# Patient Record
Sex: Female | Born: 1955
Health system: Southern US, Community
[De-identification: ages and names within clinical notes are randomized; demographics above are authoritative.]

## PROBLEM LIST (undated history)

## (undated) DIAGNOSIS — H905 Unspecified sensorineural hearing loss: Secondary | ICD-10-CM

## (undated) DIAGNOSIS — I10 Essential (primary) hypertension: Secondary | ICD-10-CM

## (undated) DIAGNOSIS — J329 Chronic sinusitis, unspecified: Secondary | ICD-10-CM

## (undated) DIAGNOSIS — G43909 Migraine, unspecified, not intractable, without status migrainosus: Secondary | ICD-10-CM

## (undated) HISTORY — DX: Essential (primary) hypertension: I10

## (undated) HISTORY — DX: Morbid (severe) obesity due to excess calories: E66.01

## (undated) HISTORY — DX: Chronic sinusitis, unspecified: J32.9

## (undated) HISTORY — DX: Unspecified sensorineural hearing loss: H90.5

## (undated) HISTORY — DX: Migraine, unspecified, not intractable, without status migrainosus: G43.909

## (undated) HISTORY — PX: BACK SURGERY: SHX140

---
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
## 1983-07-27 HISTORY — PX: BACK SURGERY: SHX140

## 1997-11-08 ENCOUNTER — Other Ambulatory Visit: Admission: RE | Admit: 1997-11-08 | Discharge: 1997-11-08 | Payer: Self-pay | Admitting: Obstetrics and Gynecology

## 2005-01-14 ENCOUNTER — Ambulatory Visit: Payer: Self-pay | Admitting: Family Medicine

## 2006-07-26 HISTORY — PX: ABDOMINAL HYSTERECTOMY: SHX81

## 2006-12-21 ENCOUNTER — Ambulatory Visit: Payer: Self-pay | Admitting: Obstetrics and Gynecology

## 2006-12-28 ENCOUNTER — Ambulatory Visit: Payer: Self-pay | Admitting: Obstetrics and Gynecology

## 2007-01-06 ENCOUNTER — Inpatient Hospital Stay: Payer: Self-pay | Admitting: Obstetrics and Gynecology

## 2007-01-24 ENCOUNTER — Ambulatory Visit: Payer: Self-pay | Admitting: Family Medicine

## 2007-01-25 ENCOUNTER — Ambulatory Visit: Payer: Self-pay | Admitting: Family Medicine

## 2007-02-16 ENCOUNTER — Ambulatory Visit: Payer: Self-pay | Admitting: Family Medicine

## 2009-04-30 ENCOUNTER — Ambulatory Visit: Payer: Self-pay | Admitting: Family Medicine

## 2010-09-23 ENCOUNTER — Other Ambulatory Visit: Payer: Self-pay | Admitting: Sports Medicine

## 2010-09-23 DIAGNOSIS — M5410 Radiculopathy, site unspecified: Secondary | ICD-10-CM

## 2010-09-23 DIAGNOSIS — M545 Low back pain, unspecified: Secondary | ICD-10-CM

## 2010-09-24 ENCOUNTER — Ambulatory Visit
Admission: RE | Admit: 2010-09-24 | Discharge: 2010-09-24 | Disposition: A | Discharge status: Home / Self Care | Payer: BC Managed Care – PPO | Source: Ambulatory Visit | Attending: Sports Medicine | Admitting: Sports Medicine

## 2010-09-24 DIAGNOSIS — M545 Low back pain, unspecified: Secondary | ICD-10-CM

## 2010-09-24 DIAGNOSIS — M5410 Radiculopathy, site unspecified: Secondary | ICD-10-CM

## 2010-09-24 MED ORDER — GADOBENATE DIMEGLUMINE 529 MG/ML IV SOLN
20.0000 mL | Freq: Once | INTRAVENOUS | Status: AC | PRN
  Administered 2010-09-24: 20 mL via INTRAVENOUS

## 2011-01-19 ENCOUNTER — Encounter (HOSPITAL_COMMUNITY)
Admission: RE | Admit: 2011-01-19 | Discharge: 2011-01-19 | Disposition: A | Discharge status: Home / Self Care | Payer: BC Managed Care – PPO | Source: Ambulatory Visit | Attending: Neurosurgery | Admitting: Neurosurgery

## 2011-01-19 LAB — CBC
HCT: 42.1 % (ref 36.0–46.0)
MCV: 81.3 fL (ref 78.0–100.0)
RDW: 14.9 % (ref 11.5–15.5)
WBC: 9.5 10*3/uL (ref 4.0–10.5)

## 2011-01-19 LAB — URINALYSIS, ROUTINE W REFLEX MICROSCOPIC
Bilirubin Urine: NEGATIVE
Hgb urine dipstick: NEGATIVE
Specific Gravity, Urine: 1.009 (ref 1.005–1.030)
Urobilinogen, UA: 0.2 mg/dL (ref 0.0–1.0)
pH: 5.5 (ref 5.0–8.0)

## 2011-01-19 LAB — BASIC METABOLIC PANEL
BUN: 14 mg/dL (ref 6–23)
CO2: 25 mEq/L (ref 19–32)
Calcium: 10.6 mg/dL — ABNORMAL HIGH (ref 8.4–10.5)
Chloride: 103 mEq/L (ref 96–112)
Creatinine, Ser: 0.65 mg/dL (ref 0.50–1.10)

## 2011-01-19 LAB — PROTIME-INR: INR: 0.96 (ref 0.00–1.49)

## 2011-01-20 ENCOUNTER — Observation Stay (HOSPITAL_COMMUNITY)
Admission: RE | Admit: 2011-01-20 | Discharge: 2011-01-21 | Disposition: A | Discharge status: Home / Self Care | Payer: BC Managed Care – PPO | Source: Ambulatory Visit | Attending: Neurosurgery | Admitting: Neurosurgery

## 2011-01-20 ENCOUNTER — Observation Stay (HOSPITAL_COMMUNITY): Payer: BC Managed Care – PPO

## 2011-01-20 DIAGNOSIS — Z01810 Encounter for preprocedural cardiovascular examination: Secondary | ICD-10-CM | POA: Insufficient documentation

## 2011-01-20 DIAGNOSIS — Z01812 Encounter for preprocedural laboratory examination: Secondary | ICD-10-CM | POA: Insufficient documentation

## 2011-01-20 DIAGNOSIS — E669 Obesity, unspecified: Secondary | ICD-10-CM | POA: Insufficient documentation

## 2011-01-20 DIAGNOSIS — Z79899 Other long term (current) drug therapy: Secondary | ICD-10-CM | POA: Insufficient documentation

## 2011-01-20 DIAGNOSIS — Z01818 Encounter for other preprocedural examination: Secondary | ICD-10-CM | POA: Insufficient documentation

## 2011-01-20 DIAGNOSIS — M1990 Unspecified osteoarthritis, unspecified site: Secondary | ICD-10-CM | POA: Insufficient documentation

## 2011-01-20 DIAGNOSIS — M5126 Other intervertebral disc displacement, lumbar region: Principal | ICD-10-CM | POA: Insufficient documentation

## 2011-01-21 NOTE — Op Note (Signed)
[name], [name]              ACCOUNT NO.:  [id number]  MEDICAL RECORD NO.:  [id number]  LOCATION:  3533                         FACILITY:  [facility]  PHYSICIAN:  Clydene Fake, M.D.  DATE OF BIRTH:  08/13/1955  DATE OF PROCEDURE:  01/20/2011 DATE OF DISCHARGE:                              OPERATIVE REPORT   DIAGNOSIS:  Recurrent herniated nucleus pulposus, left L4-5.  POSTOPERATIVE DIAGNOSIS:  Recurrent herniated nucleus pulposus, left L4- 5.  PROCEDURE:  Redo left L4-5 semi-hemilaminectomy and diskectomy, microdissection with microscope.  SURGEON:  Clydene Fake, MD.  ASSISTANT:  Coletta Memos, MD.  ANESTHESIA:  General endotracheal tube anesthesia.  ESTIMATED BLOOD LOSS:  Minimal.  BLOOD GIVEN:  None.  DRAINS:  None.  COMPLICATIONS:  None.  REASON FOR PROCEDURE:  The patient is a 55 year old woman who underwent the left lam and diskectomy over 2 months ago.  Almost in 3 months, she was doing well and back to work but then had some severe back pain, left leg pain and numbness recurrent, pain radiating down towards the foot, mainly L5 distribution.  MRI was done showing large recurrent disk herniation, left side L4-5 with fragment going caudally on the nerve root.  The patient was brought back in for redo lam and diskectomy.  PROCEDURE IN DETAIL:  The patient was brought to the operating room and general anesthesia was induced.  The patient was placed in a prone position on Wilson frame with all pressure points padded.  The patient was prepped and draped in usual sterile fashion.  Site of incision was injected with 10 mL of 1% lidocaine with epinephrine.  Incision was then made in the middle and lower lumbar spine where incision was previously. Incision was taken down the fascia.  Hemostasis was obtained with Bovie cauterization.  The fascia was excised with Bovie.  We can see the track from her prior surgeries.  Subperiosteal dissection was done over  the spinous process of lamina at L4-5.  Markers were placed at the 2 interspaces.  An x-ray was obtained in the top marker which we thought was ___________ pointing at the scar and the defect, it was in fact the 4-5 level.  Self-retaining retractors were placed at the center there. Microscope was brought in for microdissection.  There was a significant amount of scar in the laminotomy defect.  We carefully dissected through that scar with curettes and nerve hooks.  We used high-speed drill to remove the bone from laminotomy and medial facetectomy, and we removed more bone caudally over the top of L5 lamina decompressing the sac and the nerve root and decompressed that more inferiorly.  We carefully worked right down towards the disk space through a previous scar.  There was a significant amount of scar.  When we get into the disk space, diskectomy was done with pituitary rongeurs and curettes and we carefully dissected towards the nerve root and the dura and then pushing disk material back down the disk space and removing it.  The L5 nerveroot was very tense and a lot of disk material was caudal to the disk space up under the nerve root and the axilla.  We carefully dissected this and  we were able to remove this disk material decompressing the nerve root.  When we were finished, we had good decompression of the central canal and the L5 nerve root.  We checked the L4 root and ___________ did well.  We irrigated with antibiotic solution.  We had hemostasis with Gelfoam and thrombin.  This was irrigated out. Retractors were then removed and fascia closed with 0 Vicryl interrupted sutures.  Subcutaneous tissue closed with 2-0 and 3-0 Vicryl interrupted sutures.  Skin closed with Benzoin and Steri-Strips.  Dressing was placed.  The patient was placed back in the supine position, awoken from anesthesia, and transferred to recovery room in stable condition.           ______________________________ Clydene Fake, M.D.     JRH/MEDQ  D:  01/20/2011  T:  01/21/2011  Job:  [id number]  Electronically Signed by Colon Branch M.D. on 01/21/2011 02:22:43 PM

## 2011-08-31 ENCOUNTER — Ambulatory Visit: Payer: Self-pay | Admitting: Family Medicine

## 2011-10-12 ENCOUNTER — Ambulatory Visit: Payer: Self-pay | Admitting: Family Medicine

## 2012-11-03 LAB — CBC
MCH: 26.7 pg (ref 26.0–34.0)
MCV: 83 fL (ref 80–100)
Platelet: 256 10*3/uL (ref 150–440)
RBC: 5.13 10*6/uL (ref 3.80–5.20)
RDW: 15.2 % — ABNORMAL HIGH (ref 11.5–14.5)

## 2012-11-03 LAB — COMPREHENSIVE METABOLIC PANEL
Anion Gap: 5 — ABNORMAL LOW (ref 7–16)
BUN: 17 mg/dL (ref 7–18)
Bilirubin,Total: 0.2 mg/dL (ref 0.2–1.0)
Calcium, Total: 9.5 mg/dL (ref 8.5–10.1)
Chloride: 107 mmol/L (ref 98–107)
Co2: 26 mmol/L (ref 21–32)
Creatinine: 0.76 mg/dL (ref 0.60–1.30)
EGFR (Non-African Amer.): >60
SGPT (ALT): 37 U/L (ref 12–78)
Sodium: 138 mmol/L (ref 136–145)

## 2012-11-03 LAB — TROPONIN I: Troponin-I: <0.02 ng/mL

## 2012-11-03 LAB — CK TOTAL AND CKMB (NOT AT ARMC): CK-MB: 4.4 ng/mL — ABNORMAL HIGH (ref 0.5–3.6)

## 2012-11-04 ENCOUNTER — Observation Stay: Payer: Self-pay | Admitting: Internal Medicine

## 2012-11-04 LAB — CK TOTAL AND CKMB (NOT AT ARMC)
CK, Total: 175 U/L (ref 21–215)
CK-MB: 3.1 ng/mL (ref 0.5–3.6)

## 2012-11-04 LAB — TROPONIN I: Troponin-I: <0.02 ng/mL

## 2013-01-05 ENCOUNTER — Ambulatory Visit: Payer: Self-pay | Admitting: Family Medicine

## 2014-11-15 NOTE — Discharge Summary (Signed)
PATIENT NAME:  [name], [name] MR#:  [id number] DATE OF BIRTH:  1955/09/19  DATE OF ADMISSION:  11/04/2012 DATE OF DISCHARGE:  11/04/2012  PRIMARY CARE PHYSICIAN: Jerrell Belfast, MD   [place]: None.   DISCHARGE DIAGNOSES:   1. Chest pain, likely musculoskeletal.  2. Fluid retention at times.   DISCHARGE HOME MEDICATIONS: 1. Aspirin 81 mg p.o. daily.  2. Lasix 20 mg p.o. daily.  3. Klor-Con 10 mEq p.o. daily.   DISCHARGE DIET: Regular diet.   DISCHARGE ACTIVITY: As tolerated.   FOLLOW-UP INSTRUCTIONS: 1. PCP followup in 2 weeks.  2. Advised to take Motrin p.r.n. for her chest pain.   LABORATORY AND RADIOLOGICAL DATA: WBC is 8.6, hemoglobin 13.7, hematocrit 42.6, platelet count 256. Sodium 138, potassium 3.8, chloride 107, bicarbonate 26, BUN 17, creatinine 0.76, glucose 98 and calcium of 9.5.  ALT 37, AST 33, alkaline phosphatase 99, total bili 0.2, albumin of 3.9.  Troponins remained negative while in the hospital.  Myoview showing normal LV global function, EF of 63%. No evidence of pathology.   BRIEF HOSPITAL COURSE: The patient is a 59 year old Caucasian female with no significant past medical history other than hypertension, presents to the hospital secondary to chest pain. Her pain was on the left side of the chest, tender to touch, and had pleuritic component on exam.  The patient denied any trouble with reflux issues or prior cardiac problems.  Since her pain persisted, she was admitted under observation to rule out unstable angina.   Chest pain:  Likely musculoskeletal in nature or costochondritis.  She was admitted with nitro; she remained chest pain-free, however, developed a headache.  She had a Myoview done which was negative for any ischemia or infarction, and her pain improved with Tylenol and Motrin; so, she is being discharged in a stable condition. Her enzymes cardiac enzymes remained negative while in the hospital. She takes Lasix along with  potassium for fluid retention and pedal edema by the end of the day prescribed for her PCP which has not been changed at the time of discharge.   Her course has been otherwise uneventful in the hospital.   DISCHARGE CONDITION: Stable.   DISCHARGE DISPOSITION: Home.   TIME SPENT ON DISCHARGE: 40 minutes.   ____________________________ Gladstone Lighter, MD rk:cb D: 11/05/2012 11:49:14 ET T: 11/05/2012 15:09:18 ET JOB#: [id number]  cc: Gladstone Lighter, MD, <Dictator> Gladstone Lighter MD ELECTRONICALLY SIGNED 11/10/2012 15:44

## 2014-11-15 NOTE — H&P (Signed)
PATIENT NAME:  [name], [name] MR#:  [id number] DATE OF BIRTH:  [birth year]  DATE OF ADMISSION:  11/04/2012  REFERRING PHYSICIAN: Gretchen Short. Beather Arbour, MD  PRIMARY CARE PHYSICIAN: Jerrell Belfast, MD  CHIEF COMPLAINT: Chest pain.   HISTORY OF PRESENT ILLNESS: This is a 59 year old female with significant past medical history of obesity, hypertension, who presents with complaints of chest pain. Reports this pain started this a.m. when she woke up, was pressure-like quality, midsternal, nonradiating. Was accompanied by mild palpitations and sweating, but no shortness of breath, no nausea, no vomiting. Was waxing and waning all day long, worsened by ambulation, no relieving factors. When the chest pain did not improve by the afternoon, she came to ED. In ED, the patient had negative troponin and no EKG changes. Out of concern of unstable angina, she was given subcutaneous Lovenox in ED and 324 of aspirin. The patient denies any previous cardiac history. Denies any previous workup in the past, no stress test. Reports she had a similar episode of this chest pain before 2 weeks as well. As well, the patient reports history of hypertension but reports she is not on any medication for that, as it is usually controlled. She is on no Lasix for occasional lower extremity edema by the end of the day. The patient denies any coffee-ground emesis, any melena, any lower extremity swelling, any hemoptysis. Hospitalist service was requested to admit the patient for further workup of her chest pain.   PAST MEDICAL HISTORY: Hypertension.   PAST SURGICAL HISTORY:  1.  Cystoscopy.  2.  Back surgery.  3.  Tonsillectomy.  4.  Appendectomy.  5.  Hysterectomy.   HOME MEDICATIONS:  1.  Aspirin 81 mg oral daily.  2.  Lasix 20 mg oral daily.  3.  Potassium 10 mEq oral daily.   ALLERGIES: No known drug allergies.   FAMILY HISTORY: Significant for coronary artery disease in her father at age of 19.   SOCIAL HISTORY: No  smoking. No illicit drug use. No alcohol abuse. Works in [employer].   REVIEW OF SYSTEMS:  CONSTITUTIONAL: Denies fever, chills, fatigue, weakness, weight gain, weight loss.  EYES: Denies blurry vision, double vision, pain, inflammation, glaucoma.  EARS, NOSE, THROAT: Denies tinnitus, ear pain, hearing loss, epistaxis or discharge.  RESPIRATORY: Denies cough, wheezing, hemoptysis, dyspnea, COPD.  CARDIOVASCULAR: Complains of chest pain with mild palpitation. Denies syncope, edema, orthopnea.  GASTROINTESTINAL: Denies nausea, vomiting, diarrhea, abdominal pain, hematemesis, melena, rectal bleed, jaundice or constipation.  GENITOURINARY: Denies dysuria, hematuria, renal colic  ENDOCRINE: Denies polyuria, polydipsia, heat or cold intolerance  HEMATOLOGIC: Denies anemia, easy bruising, bleeding diathesis.  INTEGUMENTARY: Denies acne, rash or skin lesions.  MUSCULOSKELETAL: Denies any swelling, gout, limited activity, arthritis or cramps.  NEUROLOGIC: Denies weakness, dysarthria, epilepsy, tremors, vertigo, CVA, TIA or seizures.  PSYCHIATRIC: Denies anxiety, insomnia, nervousness or schizophrenia.   PHYSICAL EXAMINATION:  VITAL SIGNS: Temperature 98.4, pulse 68, respiratory rate 20, blood pressure 132/87, saturating 98% on room air.  GENERAL: Well-nourished obese female, comfortable in bed in no apparent distress.  HEENT: Head normocephalic. Pupils equal, reactive to light. Pink conjunctivae. Anicteric sclerae. Moist oral mucosa.  NECK: Supple. No thyromegaly. No JVD.  CHEST: Good air entry bilaterally. No wheezing, rales or rhonchi.  CARDIOVASCULAR: S1, S2 heard. No rubs, murmurs or gallops.  ABDOMEN: Soft, nontender, nondistended. Bowel sounds present.  EXTREMITIES: No edema. No clubbing. No cyanosis. Dorsalis pedis +2.  SKIN: Normal skin turgor. Warm and dry. No rash.  PSYCHIATRIC:  Appropriate affect. Awake, alert x 3. Intact judgment and insight.  NEUROLOGIC: Cranial nerves grossly  intact. Motor 5 out of 5.Marland Kitchen   PERTINENT LABORATORY AND DIAGNOSTIC DATA: Glucose 78, BUN 17, creatinine 0.76, sodium 138, potassium 3.8, chloride 107, CO2 of 26. Troponin less than 0.02, CK total 206, CK-MB 4.4. White blood cells 8.6, hemoglobin 13.7, hematocrit 42.6, platelets 256. EKG showing normal sinus rhythm without significant ST or T wave changes.   ASSESSMENT AND PLAN: This is a 59 year old female who presents with chest pain.  1.  Chest pain: The patient was given 324 of aspirin subcutaneous Lovenox in the Emergency Department out of concern of unstable angina, as pain started at rest. We will continue to cycle patient's cardiac enzymes and if negative, will schedule her for stress test in the morning. Meanwhile, currently she is chest pain-free, will be kept on sublingual nitroglycerin as needed. Will recheck lipid panel in the morning. Her blood pressure is acceptable and if needed, will start her on her beta blockers.  2.  Deep vein thrombosis prophylaxis: The patient received full dose of anticoagulation of Lovenox.   CODE STATUS: Full code.   TIME SPENT: Total time spent on admission and patient care: 45 minutes.   ____________________________ Albertine Patricia, MD dse:jm D: 11/04/2012 01:18:45 ET T: 11/04/2012 10:39:36 ET JOB#: [id number]  cc: Albertine Patricia, MD, <Dictator> Kaileena Obi Graciela Husbands MD ELECTRONICALLY SIGNED 11/05/2012 5:40

## 2015-01-29 ENCOUNTER — Other Ambulatory Visit: Payer: Self-pay | Admitting: Family Medicine

## 2015-01-29 DIAGNOSIS — E871 Hypo-osmolality and hyponatremia: Secondary | ICD-10-CM | POA: Insufficient documentation

## 2015-02-06 DIAGNOSIS — R609 Edema, unspecified: Secondary | ICD-10-CM | POA: Insufficient documentation

## 2015-02-06 DIAGNOSIS — E559 Vitamin D deficiency, unspecified: Secondary | ICD-10-CM | POA: Insufficient documentation

## 2015-02-06 DIAGNOSIS — G43909 Migraine, unspecified, not intractable, without status migrainosus: Secondary | ICD-10-CM | POA: Insufficient documentation

## 2015-02-06 DIAGNOSIS — E569 Vitamin deficiency, unspecified: Secondary | ICD-10-CM | POA: Insufficient documentation

## 2015-02-06 DIAGNOSIS — K7689 Other specified diseases of liver: Secondary | ICD-10-CM | POA: Insufficient documentation

## 2015-02-06 DIAGNOSIS — R748 Abnormal levels of other serum enzymes: Secondary | ICD-10-CM | POA: Insufficient documentation

## 2015-02-06 DIAGNOSIS — D649 Anemia, unspecified: Secondary | ICD-10-CM | POA: Insufficient documentation

## 2015-02-06 DIAGNOSIS — N1330 Unspecified hydronephrosis: Secondary | ICD-10-CM | POA: Insufficient documentation

## 2015-02-06 DIAGNOSIS — D1800 Hemangioma unspecified site: Secondary | ICD-10-CM | POA: Insufficient documentation

## 2015-03-21 ENCOUNTER — Other Ambulatory Visit: Payer: Self-pay | Admitting: Family Medicine

## 2015-03-21 DIAGNOSIS — E559 Vitamin D deficiency, unspecified: Secondary | ICD-10-CM

## 2015-03-21 NOTE — Telephone Encounter (Signed)
Last OV 10/2014  Thanks,   -Noa Galvao  

## 2016-08-05 ENCOUNTER — Ambulatory Visit (INDEPENDENT_AMBULATORY_CARE_PROVIDER_SITE_OTHER): Payer: 59 | Admitting: Physician Assistant

## 2016-08-05 ENCOUNTER — Encounter: Payer: Self-pay | Admitting: Physician Assistant

## 2016-08-05 VITALS — BP 124/76 | HR 96 | Temp 98.7°F | Resp 16 | Ht 66.0 in | Wt 275.0 lb

## 2016-08-05 DIAGNOSIS — J069 Acute upper respiratory infection, unspecified: Secondary | ICD-10-CM | POA: Diagnosis not present

## 2016-08-05 NOTE — Patient Instructions (Signed)
Upper Respiratory Infection, Adult Most upper respiratory infections (URIs) are caused by a virus. A URI affects the nose, throat, and upper air passages. The most common type of URI is often called "the common cold." Follow these instructions at home:  Take medicines only as told by your doctor.  Gargle warm saltwater or take cough drops to comfort your throat as told by your doctor.  Use a warm mist humidifier or inhale steam from a shower to increase air moisture. This may make it easier to breathe.  Drink enough fluid to keep your pee (urine) clear or pale yellow.  Eat soups and other clear broths.  Have a healthy diet.  Rest as needed.  Go back to work when your fever is gone or your doctor says it is okay.  You may need to stay home longer to avoid giving your URI to others.  You can also wear a face mask and wash your hands often to prevent spread of the virus.  Use your inhaler more if you have asthma.  Do not use any tobacco products, including cigarettes, chewing tobacco, or electronic cigarettes. If you need help quitting, ask your doctor. Contact a doctor if:  You are getting worse, not better.  Your symptoms are not helped by medicine.  You have chills.  You are getting more short of breath.  You have brown or red mucus.  You have yellow or brown discharge from your nose.  You have pain in your face, especially when you bend forward.  You have a fever.  You have puffy (swollen) neck glands.  You have pain while swallowing.  You have white areas in the back of your throat. Get help right away if:  You have very bad or constant:  Headache.  Ear pain.  Pain in your forehead, behind your eyes, and over your cheekbones (sinus pain).  Chest pain.  You have long-lasting (chronic) lung disease and any of the following:  Wheezing.  Long-lasting cough.  Coughing up blood.  A change in your usual mucus.  You have a stiff neck.  You have  changes in your:  Vision.  Hearing.  Thinking.  Mood. This information is not intended to replace advice given to you by your health care provider. Make sure you discuss any questions you have with your health care provider. Document Released: 12/29/2007 Document Revised: 03/14/2016 Document Reviewed: 10/17/2013 Elsevier Interactive Patient Education  2017 [employer]  

## 2016-08-05 NOTE — Progress Notes (Signed)
[place]  Chief Complaint  Patient presents with  . URI    Started two days ago.    Subjective:    Patient ID: [name], female    DOB: 10/24/1955, 61 y.o.   MRN: [id number]  Upper Respiratory Infection: Special Gartley Huhn is a 61 y.o. femalecomplaining of symptoms of a URI. Symptoms include congestion and cough. Onset of symptoms was 2 days ago, gradually worsening since that time. Denies fever, endorses some chills. No nausea or vomiting. Did receive flu shot. Minimally productive cough. Non smoker. She also c/o right ear pressure/pain, cough described as productive, nasal congestion and no  fever for the past 2 days .  She is drinking plenty of fluids. Evaluation to date: none. Treatment to date: cough suppressants. The treatment has provided minimal relief.   Review of Systems  Constitutional: Positive for appetite change and fatigue. Negative for activity change, chills, diaphoresis, fever and unexpected weight change.  HENT: Positive for congestion, ear pain, postnasal drip and rhinorrhea. Negative for ear discharge, nosebleeds, sinus pain and sinus pressure.   Eyes: Negative.   Respiratory: Positive for cough, chest tightness, shortness of breath and wheezing.   Neurological: Negative for dizziness, light-headedness and headaches.       Objective:   BP 124/76 (BP Location: Left Arm, Patient Position: Sitting, Cuff Size: Large)   Pulse 96   Temp 98.7 F (37.1 C) (Oral)   Resp 16   Ht 5\' 6"  (1.676 m)   Wt 275 lb (124.7 kg)   BMI 44.39 kg/m   Patient Active Problem List   Diagnosis Date Noted  . Absolute anemia 02/06/2015  . Accumulation of fluid in tissues 02/06/2015  . Abnormal liver enzymes 02/06/2015  . Angioma 02/06/2015  . Hydronephrosis 02/06/2015  . Calcium blood increased 02/06/2015  . Hepatic cyst 02/06/2015  . Headache, migraine 02/06/2015  . Avitaminosis D 02/06/2015  . Avitaminosis 02/06/2015  .  Hyponatremia 01/29/2015    Outpatient Encounter Prescriptions as of 08/05/2016  Medication Sig Note  . ibuprofen (MOTRIN IB) 200 MG tablet Take by mouth. 02/06/2015: Medication taken as needed.  Received from: [facility]  . MULTIPLE VITAMIN PO Take by mouth. 02/06/2015: Received from: [facility]  . Vitamin D, Ergocalciferol, (DRISDOL) 50000 UNITS CAPS capsule take 1 capsule by mouth every week   . furosemide (LASIX) 20 MG tablet Take by mouth. 02/06/2015: Received from: [facility]  . KLOR-CON M10 10 MEQ tablet TAKE 1 TABLET BY MOUTH TWO TIMES DAILY (Patient not taking: Reported on 08/05/2016)   . [DISCONTINUED] potassium chloride (K-DUR) 10 MEQ tablet Take by mouth. 02/06/2015: Received from: [facility]   No facility-administered encounter medications on file as of 08/05/2016.     Not on File     Physical Exam  Constitutional: She appears well-developed and well-nourished.  HENT:  Right Ear: External ear normal.  Left Ear: External ear normal.  Mouth/Throat: Oropharynx is clear and moist. No oropharyngeal exudate.  Tms opaque bilaterally.  Eyes: Right eye exhibits discharge. Left eye exhibits discharge.  Neck: Neck supple.  Cardiovascular: Normal rate and regular rhythm.   Pulmonary/Chest: Effort normal. No respiratory distress. She has wheezes in the left upper field. She has no rales.  Lymphadenopathy:    She has no cervical adenopathy.  Skin: Skin is warm and dry.  Psychiatric: She has a normal mood and affect. Her behavior is normal.       Assessment & Plan:  1. Upper respiratory tract infection, unspecified type  May treat symptomatically with Mucinex. May call back in >7 days if infection has travelled to sinuses. Counseled on return precautions.  Recommend rest, fluids, frequent hand washing. Work note provided  Patient Instructions  Upper Respiratory Infection, Adult Most upper respiratory  infections (URIs) are caused by a virus. A URI affects the nose, throat, and upper air passages. The most common type of URI is often called "the common cold." Follow these instructions at home:  Take medicines only as told by your doctor.  Gargle warm saltwater or take cough drops to comfort your throat as told by your doctor.  Use a warm mist humidifier or inhale steam from a shower to increase air moisture. This may make it easier to breathe.  Drink enough fluid to keep your pee (urine) clear or pale yellow.  Eat soups and other clear broths.  Have a healthy diet.  Rest as needed.  Go back to work when your fever is gone or your doctor says it is okay.  You may need to stay home longer to avoid giving your URI to others.  You can also wear a face mask and wash your hands often to prevent spread of the virus.  Use your inhaler more if you have asthma.  Do not use any tobacco products, including cigarettes, chewing tobacco, or electronic cigarettes. If you need help quitting, ask your doctor. Contact a doctor if:  You are getting worse, not better.  Your symptoms are not helped by medicine.  You have chills.  You are getting more short of breath.  You have brown or red mucus.  You have yellow or brown discharge from your nose.  You have pain in your face, especially when you bend forward.  You have a fever.  You have puffy (swollen) neck glands.  You have pain while swallowing.  You have white areas in the back of your throat. Get help right away if:  You have very bad or constant:  Headache.  Ear pain.  Pain in your forehead, behind your eyes, and over your cheekbones (sinus pain).  Chest pain.  You have long-lasting (chronic) lung disease and any of the following:  Wheezing.  Long-lasting cough.  Coughing up blood.  A change in your usual mucus.  You have a stiff neck.  You have changes in  your:  Vision.  Hearing.  Thinking.  Mood. This information is not intended to replace advice given to you by your health care provider. Make sure you discuss any questions you have with your health care provider. Document Released: 12/29/2007 Document Revised: 03/14/2016 Document Reviewed: 10/17/2013 Elsevier Interactive Patient Education  2017 [employer].     The entirety of the information documented in the History of Present Illness, Review of Systems and Physical Exam were personally obtained by me. Portions of this information were initially documented by Ashley Royalty, CMA and reviewed by me for thoroughness and accuracy.

## 2018-09-27 ENCOUNTER — Encounter: Payer: Self-pay | Admitting: Physician Assistant

## 2018-09-27 ENCOUNTER — Ambulatory Visit: Payer: 59 | Admitting: Physician Assistant

## 2018-09-27 VITALS — BP 134/86 | HR 90 | Temp 98.6°F | Resp 16 | Wt 284.0 lb

## 2018-09-27 DIAGNOSIS — R195 Other fecal abnormalities: Secondary | ICD-10-CM

## 2018-09-27 DIAGNOSIS — R197 Diarrhea, unspecified: Secondary | ICD-10-CM

## 2018-09-27 NOTE — Progress Notes (Signed)
Patient: [name] Female    DOB: 08-26-1955   63 y.o.   MRN: [id number] Visit Date: 09/27/2018  Today's Provider: Trinna Post, PA-C   Chief Complaint  Patient presents with  . Diarrhea   Subjective:     HPI   Bowel habit changes:  Patient comes in today complainng of chanhges in her bowel movemenvt. She states the consitenher stools have become more loose and mushy. She denies any abdominal pain, or blood in her stools. She has one dowel movement daily. Denies change in eating habits, denies travel outside the country. No camping. No antibiotics in the past three months. Reports grandmother with colon cancer in her 51's. Denies crohns or ulcerative colitis. Denies unintended weight loss, night sweats, fevers. Denies blood or mucous in stool. Reports all bowel movements are unformed. Last colonoscopy 2013: normal but for some internal hemorrhoids.   Wt Readings from Last 3 Encounters:  09/27/18 284 lb (128.8 kg)  08/05/16 275 lb (124.7 kg)  10/28/14 270 lb (122.5 kg)   Preventive Screening: Patient has not had CPE in many years. She had a hysterectomy but does still have a cervix. Has not had mammogram since 2008. Reports she got a bill a few years ago for labwork that was very expensive nad hasn't come back unless absolutely necessary since then.    Not on File   Current Outpatient Medications:  .  furosemide (LASIX) 20 MG tablet, Take by mouth., Disp: , Rfl:  .  ibuprofen (MOTRIN IB) 200 MG tablet, Take by mouth., Disp: , Rfl:  .  KLOR-CON M10 10 MEQ tablet, TAKE 1 TABLET BY MOUTH TWO TIMES DAILY (Patient not taking: Reported on 08/05/2016), Disp: 60 tablet, Rfl: 3 .  MULTIPLE VITAMIN PO, Take by mouth., Disp: , Rfl:  .  Vitamin D, Ergocalciferol, (DRISDOL) 50000 UNITS CAPS capsule, take 1 capsule by mouth every week (Patient not taking: Reported on 09/27/2018), Disp: 4 capsule, Rfl: 4  Review of Systems  Gastrointestinal:       Loose stools  All other  systems reviewed and are negative.   Social History   Tobacco Use  . Smoking status: Never Smoker  . Smokeless tobacco: Never Used  Substance Use Topics  . Alcohol use: No      Objective:   BP 134/86   Pulse 90   Temp 98.6 F (37 C) (Oral)   Resp 16   Wt 284 lb (128.8 kg)   SpO2 95% Comment: room air  BMI 45.84 kg/m  Vitals:   09/27/18 0827  BP: 134/86  Pulse: 90  Resp: 16  Temp: 98.6 F (37 C)  TempSrc: Oral  SpO2: 95%  Weight: 284 lb (128.8 kg)     Physical Exam Constitutional:      Appearance: Normal appearance. She is obese.  Cardiovascular:     Rate and Rhythm: Normal rate and regular rhythm.     Heart sounds: Normal heart sounds.  Pulmonary:     Effort: Pulmonary effort is normal.     Breath sounds: Normal breath sounds.  Abdominal:     General: Bowel sounds are normal. There is no distension.     Palpations: Abdomen is soft.     Tenderness: There is no abdominal tenderness.  Skin:    General: Skin is warm and dry.  Neurological:     Mental Status: She is alert and oriented to person, place, and time. Mental status is at baseline.  Psychiatric:  Mood and Affect: Mood normal.        Behavior: Behavior normal.         Assessment & Plan    1. Diarrhea, unspecified type  Change in bowel habit, refer to GI. She has not had preventive screening in many years. Encouraged her to schedule CPE and check with insurance regarding coverage as preventive screenings should be fully covered.  - Ambulatory referral to Gastroenterology  2. Change in stool  - Ambulatory referral to Gastroenterology  The entirety of the information documented in the History of Present Illness, Review of Systems and Physical Exam were personally obtained by me. Portions of this information were initially documented by Meyer Cory, CMA and reviewed by me for thoroughness and accuracy.    Return if symptoms worsen or fail to improve.        Trinna Post,  PA-C  [facility]

## 2018-09-27 NOTE — Patient Instructions (Signed)

## 2018-10-19 ENCOUNTER — Telehealth: Payer: 59 | Admitting: Gastroenterology

## 2018-10-19 ENCOUNTER — Ambulatory Visit (INDEPENDENT_AMBULATORY_CARE_PROVIDER_SITE_OTHER): Payer: 59 | Admitting: Gastroenterology

## 2018-10-19 DIAGNOSIS — K529 Noninfective gastroenteritis and colitis, unspecified: Secondary | ICD-10-CM | POA: Diagnosis not present

## 2018-10-19 DIAGNOSIS — R109 Unspecified abdominal pain: Secondary | ICD-10-CM | POA: Diagnosis not present

## 2018-10-19 NOTE — Progress Notes (Signed)
Kristina Darby, MD [address]  [place]  [place], [place] [address]  Main: [phone]  Fax: [phone]    Gastroenterology Consultation Virtual/Tele Visit  Referring Provider:     Paulene Floor Primary Care Physician:  Trinna Post, PA-C Primary Gastroenterologist:  Dr. Cephas Salinas Reason for Consultation:     Loose stools        HPI:   [name] is a 63 y.o. female referred by Dr. Trinna Post, PA-C  for consultation & management of chronic diarrhea  Virtual Visit via Telephone Note  I connected with [name] on 10/19/18 at 11:30 AM EDT by telephone and verified that I am speaking with the correct person using two identifiers.   I discussed the limitations, risks, security and privacy concerns of performing an evaluation and management service by telephone and the availability of in person appointments. I also discussed with the patient that there may be a patient responsible charge related to this service. The patient expressed understanding and agreed to proceed.  Location of the Patient: Work  Location of the provider: Home office   History of Present Illness:   Diarrhea: Started end of January, has been experiencing loose, mushy, brown BMs mostly once a day a/w gas and sometimes foul smelling and severe cramps. Watched what she is eating, did not make any difference, not post prandial. Denies weight loss, abdominal pain, bloating. Had regular BMs prior to this.  Went to [place] in early Feb, felt the same, didn't get worse after eating outside food. Denies rectal bleeding Obese, denies red meat, carbonated beverages  No labs in several years. Denies any other symptoms  NSAIDs: ibuprofen as needed for HAs  Antiplts/Anticoagulants/Anti thrombotics: none  GI Procedures:  Colonoscopy in 2013 normal Grandmother with colon cancer in 23s  No past medical history on file.   Current Outpatient Medications:  .   ibuprofen (MOTRIN IB) 200 MG tablet, Take by mouth., Disp: , Rfl:  .  MULTIPLE VITAMIN PO, Take by mouth., Disp: , Rfl:  .  furosemide (LASIX) 20 MG tablet, Take by mouth., Disp: , Rfl:  .  KLOR-CON M10 10 MEQ tablet, TAKE 1 TABLET BY MOUTH TWO TIMES DAILY (Patient not taking: Reported on 08/05/2016), Disp: 60 tablet, Rfl: 3 .  Vitamin D, Ergocalciferol, (DRISDOL) 50000 UNITS CAPS capsule, take 1 capsule by mouth every week (Patient not taking: Reported on 09/27/2018), Disp: 4 capsule, Rfl: 4   Family History  Problem Relation Age of Onset  . Bipolar disorder Mother   . Emphysema Mother   . Depression Mother   . Ulcers Mother   . Heart attack Father   . Hypertension Sister   . Edema Sister   . Colon cancer Maternal Grandmother   . Heart attack Paternal Grandfather   . Hypertension Brother   . Arthritis Brother      Social History   Tobacco Use  . Smoking status: Never Smoker  . Smokeless tobacco: Never Used  Substance Use Topics  . Alcohol use: No  . Drug use: No    Allergies as of 10/19/2018  . (No Known Allergies)     Imaging Studies: Reviewed  Assessment and Plan:   Aleigh Grunden Salinas is a 63 y.o. female with obesity, no significant PMH with approximately 3 months h/o chronic non bloody diarrhea without weight loss, abdominal pain, rectal bleeding or nocturnal diarrhea  Check CBC, CMP, TSH TTG, total IgA Stool studies  including C diff Pancreatic fecal elastase Offered dicyclomine, she wants to wait Has imodium at home   Follow Up Instructions:   I discussed the assessment and treatment plan with the patient. The patient was provided an opportunity to ask questions and all were answered. The patient agreed with the plan and demonstrated an understanding of the instructions.   The patient was advised to call back or seek an in-person evaluation if the symptoms worsen or if the condition fails to improve as anticipated.  I provided 25 minutes of non-face-to-face  time during this encounter.   Follow up in 4weeks   Kristina Darby, MD

## 2018-10-20 DIAGNOSIS — K529 Noninfective gastroenteritis and colitis, unspecified: Secondary | ICD-10-CM | POA: Diagnosis not present

## 2018-10-20 LAB — COMPREHENSIVE METABOLIC PANEL
ALBUMIN: 4.3 g/dL (ref 3.8–4.8)
ALK PHOS: 89 IU/L (ref 39–117)
ALT: 23 IU/L (ref 0–32)
AST: 21 IU/L (ref 0–40)
Albumin/Globulin Ratio: 1.8 (ref 1.2–2.2)
BUN / CREAT RATIO: 21 (ref 12–28)
BUN: 15 mg/dL (ref 8–27)
Bilirubin Total: <0.2 mg/dL (ref 0.0–1.2)
CO2: 21 mmol/L (ref 20–29)
CREATININE: 0.73 mg/dL (ref 0.57–1.00)
Calcium: 10.6 mg/dL — ABNORMAL HIGH (ref 8.7–10.3)
Chloride: 100 mmol/L (ref 96–106)
GFR calc Af Amer: 102 mL/min/{1.73_m2} (ref >59)
GFR calc non Af Amer: 89 mL/min/{1.73_m2} (ref >59)
GLUCOSE: 82 mg/dL (ref 65–99)
Globulin, Total: 2.4 g/dL (ref 1.5–4.5)
Potassium: 4.9 mmol/L (ref 3.5–5.2)
Sodium: 138 mmol/L (ref 134–144)
Total Protein: 6.7 g/dL (ref 6.0–8.5)

## 2018-10-20 LAB — CBC
HEMATOCRIT: 42.8 % (ref 34.0–46.6)
Hemoglobin: 13.7 g/dL (ref 11.1–15.9)
MCH: 26.4 pg — ABNORMAL LOW (ref 26.6–33.0)
MCHC: 32 g/dL (ref 31.5–35.7)
MCV: 83 fL (ref 79–97)
PLATELETS: 304 10*3/uL (ref 150–450)
RBC: 5.18 x10E6/uL (ref 3.77–5.28)
RDW: 14.4 % (ref 11.7–15.4)
WBC: 9.7 10*3/uL (ref 3.4–10.8)

## 2018-10-20 LAB — IGA: IGA/IMMUNOGLOBULIN A, SERUM: 153 mg/dL (ref 87–352)

## 2018-10-20 LAB — TSH: TSH: 2.94 u[IU]/mL (ref 0.450–4.500)

## 2018-10-20 LAB — TISSUE TRANSGLUTAMINASE, IGA: Transglutaminase IgA: <2 U/mL (ref 0–3)

## 2018-10-23 LAB — PANCREATIC ELASTASE, FECAL: PANCREATIC ELASTASE, FECAL: 477 ug Elast./g (ref >200)

## 2018-10-25 ENCOUNTER — Ambulatory Visit: Payer: BC Managed Care – PPO | Admitting: Gastroenterology

## 2018-10-25 LAB — GI PROFILE, STOOL, PCR

## 2018-11-01 ENCOUNTER — Telehealth: Payer: Self-pay | Admitting: Gastroenterology

## 2018-11-01 NOTE — Telephone Encounter (Signed)
Left vm to schedule 4 week televisit f/u with  Dr. Marius Ditch

## 2018-11-09 ENCOUNTER — Ambulatory Visit (INDEPENDENT_AMBULATORY_CARE_PROVIDER_SITE_OTHER): Payer: 59 | Admitting: Gastroenterology

## 2018-11-09 DIAGNOSIS — K591 Functional diarrhea: Secondary | ICD-10-CM

## 2018-11-09 NOTE — Progress Notes (Signed)
Kristina Darby, MD [address]  [place]  [place], [place] [address]  Main: [phone]  Fax: [phone]    Gastroenterology Consultation Virtual/Tele Visit  Referring Provider:     Paulene Floor Primary Care Physician:  Kristina Post, PA-C Primary Gastroenterologist:  Dr. Cephas Salinas Reason for Consultation:     Loose stools        HPI:   [name] is a 63 y.o. female referred by Dr. Trinna Post, PA-C  for consultation & management of chronic diarrhea  Virtual Visit via Telephone Note  I connected with [name] on 11/09/18 at 10:00 AM EDT by telephone and verified that I am speaking with the correct person using two identifiers.   I discussed the limitations, risks, security and privacy concerns of performing an evaluation and management service by telephone and the availability of in person appointments. I also discussed with the patient that there may be a patient responsible charge related to this service. The patient expressed understanding and agreed to proceed.  Location of the Patient: Work  Location of the provider: Home office   History of Present Illness:   Diarrhea: Started end of January, has been experiencing loose, mushy, brown BMs mostly once a day a/w gas and sometimes foul smelling and severe cramps. Watched what she is eating, did not make any difference, not Salinas prandial. Denies weight loss, abdominal pain, bloating. Had regular BMs prior to this.  Went to [place] in early Feb, felt the same, didn't get worse after eating outside food. Denies rectal bleeding Obese, denies red meat, carbonated beverages  No labs in several years. Denies any other symptoms  Follow-up note 11/09/2018 Patient had work-up including stool studies to rule out infection which was negative, C. difficile toxin negative, normal pancreatic fecal elastase, TSH, TTG IgA, total IgA, CBC, CMP.  Since last visit she reports that her bowel  movements are not as watery as before and are less frequent.  She tried lettuce with dressing last week which she felt her diarrhea was worse associated with severe bloating.  She has been avoiding it since then.  She thinks her symptoms are mostly related to eating habits as she felt she is not eating quite healthy as before since staying at home.  She went on a keto diet in 2018, lost about 30 pounds and she felt great.  She is thinking to go back on keto diet again  NSAIDs: ibuprofen as needed for HAs  Antiplts/Anticoagulants/Anti thrombotics: none  GI Procedures:  Colonoscopy in 2013 normal Grandmother with colon cancer in 39s  No past medical history on file.   Current Outpatient Medications:  .  furosemide (LASIX) 20 MG tablet, Take by mouth., Disp: , Rfl:  .  ibuprofen (MOTRIN IB) 200 MG tablet, Take by mouth., Disp: , Rfl:  .  KLOR-CON M10 10 MEQ tablet, TAKE 1 TABLET BY MOUTH TWO TIMES DAILY (Patient not taking: Reported on 08/05/2016), Disp: 60 tablet, Rfl: 3 .  MULTIPLE VITAMIN PO, Take by mouth., Disp: , Rfl:  .  Vitamin D, Ergocalciferol, (DRISDOL) 50000 UNITS CAPS capsule, take 1 capsule by mouth every week (Patient not taking: Reported on 09/27/2018), Disp: 4 capsule, Rfl: 4   Family History  Problem Relation Age of Onset  . Bipolar disorder Mother   . Emphysema Mother   . Depression Mother   . Ulcers Mother   . Heart attack Father   . Hypertension Sister   .  Edema Sister   . Colon cancer Maternal Grandmother   . Heart attack Paternal Grandfather   . Hypertension Brother   . Arthritis Brother      Social History   Tobacco Use  . Smoking status: Never Smoker  . Smokeless tobacco: Never Used  Substance Use Topics  . Alcohol use: No  . Drug use: No    Allergies as of 11/09/2018  . (No Known Allergies)     Imaging Studies: Reviewed  Assessment and Plan:   [name] is a 63 y.o. female with obesity, no significant PMH with approximately 3 months  h/o chronic non bloody diarrhea without weight loss, abdominal pain, rectal bleeding or nocturnal diarrhea.  Work-up has been negative so far including infectious etiology, exocrine pancreatic insufficiency, celiac serologies, thyroid panel unremarkable.  Probably her symptoms are related to eating habits, IBS since her symptoms started with beginning of COVID-19 pandemic in January.  Other possibilities include microscopic colitis or less likely inflammatory bowel disease.  I discussed with her about pursuing colonoscopy in next 2 to 3 months if her symptoms get worse and she is agreeable.      Follow Up Instructions:   I discussed the assessment and treatment plan with the patient. The patient was provided an opportunity to ask questions and all were answered. The patient agreed with the plan and demonstrated an understanding of the instructions.   The patient was advised to call back or seek an in-person evaluation if the symptoms worsen or if the condition fails to improve as anticipated.  I provided 15 minutes of non-face-to-face time during this encounter.   Follow up in 3 months   Kristina Darby, MD

## 2018-11-15 ENCOUNTER — Encounter: Payer: Self-pay | Admitting: Physician Assistant

## 2019-01-03 ENCOUNTER — Encounter: Payer: Self-pay | Admitting: Physician Assistant

## 2019-02-08 ENCOUNTER — Ambulatory Visit: Payer: BC Managed Care – PPO | Admitting: Gastroenterology

## 2019-03-13 ENCOUNTER — Encounter: Payer: Self-pay | Admitting: Physician Assistant

## 2019-03-13 NOTE — Progress Notes (Deleted)
Patient: [name], Female    DOB: 1956-05-21, 63 y.o.   MRN: [id number] Visit Date: 03/13/2019  Today's Provider: Trinna Post, PA-C   No chief complaint on file.  Subjective:     Annual physical exam [name] is a 63 y.o. female who presents today for health maintenance and complete physical. She feels {DESC; WELL/FAIRLY WELL/POORLY:18703}. She reports exercising ***. She reports she is sleeping {DESC; WELL/FAIRLY WELL/POORLY:18703}.  -----------------------------------------------------------------   Review of Systems  Social History      She  reports that she has never smoked. She has never used smokeless tobacco. She reports that she does not drink alcohol or use drugs.       Social History   Socioeconomic History  . Marital status: Married    Spouse name: Not on file  . Number of children: 4  . Years of education: H/S  . Highest education level: Not on file  Occupational History    Comment: Full-time 50-60 hours weekly  Social Needs  . Financial resource strain: Not on file  . Food insecurity    Worry: Not on file    Inability: Not on file  . Transportation needs    Medical: Not on file    Non-medical: Not on file  Tobacco Use  . Smoking status: Never Smoker  . Smokeless tobacco: Never Used  Substance and Sexual Activity  . Alcohol use: No  . Drug use: No  . Sexual activity: Not on file  Lifestyle  . Physical activity    Days per week: Not on file    Minutes per session: Not on file  . Stress: Not on file  Relationships  . Social Herbalist on phone: Not on file    Gets together: Not on file    Attends religious service: Not on file    Active member of club or organization: Not on file    Attends meetings of clubs or organizations: Not on file    Relationship status: Not on file  Other Topics Concern  . Not on file  Social History Narrative  . Not on file    No past medical history on file.   Patient  Active Problem List   Diagnosis Date Noted  . Absolute anemia 02/06/2015  . Accumulation of fluid in tissues 02/06/2015  . Abnormal liver enzymes 02/06/2015  . Angioma 02/06/2015  . Hydronephrosis 02/06/2015  . Calcium blood increased 02/06/2015  . Hepatic cyst 02/06/2015  . Headache, migraine 02/06/2015  . Avitaminosis D 02/06/2015  . Avitaminosis 02/06/2015  . Hyponatremia 01/29/2015    *** The histories are not reviewed yet. Please review them in the "History" navigator section and refresh this [place].  Family History        Family Status  Relation Name Status  . Mother  Deceased  . Father  Deceased at age 15       from a heart attack  . Sister ##Sister1 Alive  . Brother ##Brother1 Deceased       Died from [place] in 11-Nov-1971  . MGM  Deceased at age 65       Died from [place]  . PGF  Deceased at age 37's       Heart Attack  . Brother ##Brother2 Alive        Her family history includes Arthritis in her brother; Bipolar disorder in her mother; Colon cancer in her maternal grandmother; Depression in her  mother; Edema in her sister; Emphysema in her mother; Heart attack in her father and paternal grandfather; Hypertension in her brother and sister; Ulcers in her mother.      No Known Allergies   Current Outpatient Medications:  .  furosemide (LASIX) 20 MG tablet, Take by mouth., Disp: , Rfl:  .  ibuprofen (MOTRIN IB) 200 MG tablet, Take by mouth., Disp: , Rfl:  .  KLOR-CON M10 10 MEQ tablet, TAKE 1 TABLET BY MOUTH TWO TIMES DAILY (Patient not taking: Reported on 08/05/2016), Disp: 60 tablet, Rfl: 3 .  MULTIPLE VITAMIN PO, Take by mouth., Disp: , Rfl:  .  Vitamin D, Ergocalciferol, (DRISDOL) 50000 UNITS CAPS capsule, take 1 capsule by mouth every week (Patient not taking: Reported on 09/27/2018), Disp: 4 capsule, Rfl: 4   Patient Care Team: Paulene Floor as PCP - General (Physician Assistant)    Objective:    Vitals: There were no vitals taken for this visit.   There were no vitals filed for this visit.   Physical Exam   Depression Screen No flowsheet data found.     Assessment & Plan:     Routine Health Maintenance and Physical Exam  Exercise Activities and Dietary recommendations Goals   None     Immunization History  Administered Date(s) Administered  . Tdap 04/10/2009    Health Maintenance  Topic Date Due  . Hepatitis C Screening  [birth year]  . HIV Screening  06/04/1971  . PAP SMEAR-Modifier  06/03/1977  . MAMMOGRAM  08/30/2013  . INFLUENZA VACCINE  02/24/2019  . TETANUS/TDAP  04/11/2019  . COLONOSCOPY  09/29/2021     Discussed health benefits of physical activity, and encouraged her to engage in regular exercise appropriate for her age and condition.    --------------------------------------------------------------------    Trinna Post, PA-C  [place]

## 2019-03-14 ENCOUNTER — Encounter: Payer: Self-pay | Admitting: Physician Assistant

## 2019-08-02 ENCOUNTER — Ambulatory Visit: Payer: 59 | Attending: Internal Medicine

## 2019-08-02 DIAGNOSIS — Z20822 Contact with and (suspected) exposure to covid-19: Secondary | ICD-10-CM

## 2019-08-04 LAB — NOVEL CORONAVIRUS, NAA: SARS-CoV-2, NAA: NOT DETECTED

## 2019-08-04 LAB — SPECIMEN STATUS REPORT

## 2019-08-29 ENCOUNTER — Other Ambulatory Visit: Payer: 59

## 2019-08-29 ENCOUNTER — Ambulatory Visit: Payer: 59 | Attending: Internal Medicine

## 2019-08-29 DIAGNOSIS — Z20822 Contact with and (suspected) exposure to covid-19: Secondary | ICD-10-CM

## 2019-08-30 LAB — NOVEL CORONAVIRUS, NAA: SARS-CoV-2, NAA: NOT DETECTED

## 2019-12-14 ENCOUNTER — Encounter: Payer: Self-pay | Admitting: Physician Assistant

## 2019-12-14 ENCOUNTER — Other Ambulatory Visit: Payer: Self-pay

## 2019-12-14 ENCOUNTER — Ambulatory Visit: Payer: 59 | Admitting: Physician Assistant

## 2019-12-14 ENCOUNTER — Ambulatory Visit: Payer: Self-pay | Admitting: *Deleted

## 2019-12-14 VITALS — BP 140/80 | HR 90 | Temp 97.2°F | Resp 16 | Ht 66.0 in | Wt 292.4 lb

## 2019-12-14 DIAGNOSIS — L03116 Cellulitis of left lower limb: Secondary | ICD-10-CM

## 2019-12-14 DIAGNOSIS — R601 Generalized edema: Secondary | ICD-10-CM

## 2019-12-14 MED ORDER — FUROSEMIDE 20 MG PO TABS: 20.0000 mg | ORAL_TABLET | Freq: Every day | ORAL | 0 refills | Status: DC

## 2019-12-14 MED ORDER — CEPHALEXIN 500 MG PO CAPS: 500.0000 mg | ORAL_CAPSULE | Freq: Two times a day (BID) | ORAL | 0 refills | Status: DC

## 2019-12-14 NOTE — Telephone Encounter (Signed)
   Reason for Disposition . [1] Red area or streak [2] large (> 2 in. or 5 cm)  Answer Assessment - Initial Assessment Questions 1. ONSET: "When did the swelling start?" (e.g., minutes, hours, days)     Normally has swelling- not this bad- 2 weeks ago not going down at night. 2. LOCATION: "What part of the leg is swollen?"  "Are both legs swollen or just one leg?"     Left leg 3. SEVERITY: "How bad is the swelling?" (e.g., localized; mild, moderate, severe)  - Localized - small area of swelling localized to one leg  - MILD pedal edema - swelling limited to foot and ankle, pitting edema < 1/4 inch (6 mm) deep, rest and elevation eliminate most or all swelling  - MODERATE edema - swelling of lower leg to knee, pitting edema > 1/4 inch (6 mm) deep, rest and elevation only partially reduce swelling  - SEVERE edema - swelling extends above knee, facial or hand swelling present      moderate 4. REDNESS: "Does the swelling look red or infected?"     Monday night- toes started changing color- leg hot 5. PAIN: "Is the swelling painful to touch?" If so, ask: "How painful is it?"   (Scale 1-10; mild, moderate or severe)     Yesterday- painful 6. FEVER: "Do you have a fever?" If so, ask: "What is it, how was it measured, and when did it start?"      no 7. CAUSE: "What do you think is causing the leg swelling?"     Infection/circulation issue 8. MEDICAL HISTORY: "Do you have a history of heart failure, kidney disease, liver failure, or cancer?"     no 9. RECURRENT SYMPTOM: "Have you had leg swelling before?" If so, ask: "When was the last time?" "What happened that time?"     Yes- not this bad 10. OTHER SYMPTOMS: "Do you have any other symptoms?" (e.g., chest pain, difficulty breathing)       no 11. PREGNANCY: "Is there any chance you are pregnant?" "When was your last menstrual period?"       n/a  Protocols used: LEG SWELLING AND EDEMA-A-AH

## 2019-12-14 NOTE — Progress Notes (Signed)
I,Joseline E Rosas,acting as a scribe for Trinna Post, PA-C.,have documented all relevant documentation on the behalf of Trinna Post, PA-C,as directed by  Trinna Post, PA-C while in the presence of Trinna Post, PA-C.  Established patient visit   Patient: [name]   DOB: 05/07/1956   64 y.o. Female  MRN: [id number] Visit Date: 12/14/2019  Today's healthcare provider: Trinna Post, PA-C   Chief Complaint  Patient presents with  . Edema   Subjective    HPI Edema: Patient complains of edema. Reports that she normally has swelling but not his bad.The location of the edema is lower leg(s) left.  Reports that the swelling is from her knee to her foot. Reports that since Monday her toes started to changed color.The edema has been localized. The edema has been present for the past two weeks but worsening over this past weekend. The edema is present all day.She does have some redness on he lower leg and reports that is hot to touch. The patient has history of swelling but not like this. Cardiac risk factors include obesity (BMI >= 30 kg/m2).  Reports that she has been on long trip.She went to the beach.     Medications: Outpatient Medications Prior to Visit  Medication Sig  . ibuprofen (MOTRIN IB) 200 MG tablet Take by mouth.  . furosemide (LASIX) 20 MG tablet Take by mouth.  Marland Kitchen KLOR-CON M10 10 MEQ tablet TAKE 1 TABLET BY MOUTH TWO TIMES DAILY (Patient not taking: Reported on 08/05/2016)  . MULTIPLE VITAMIN PO Take by mouth.  . Vitamin D, Ergocalciferol, (DRISDOL) 50000 UNITS CAPS capsule take 1 capsule by mouth every week (Patient not taking: Reported on 09/27/2018)   No facility-administered medications prior to visit.    Review of Systems  Constitutional: Negative for chills and fever.  Cardiovascular: Positive for leg swelling. Negative for chest pain and palpitations.  Neurological: Positive for numbness ("this is something common since 2012 on her left  leg") and headaches. Negative for dizziness and weakness.      Objective    BP 140/80 (BP Location: Left Arm, Patient Position: Sitting, Cuff Size: Large)   Pulse 90   Temp (!) 97.2 F (36.2 C) (Temporal)   Resp 16   Ht 5\' 6"  (1.676 m)   Wt 292 lb 6.4 oz (132.6 kg)   SpO2 96%   BMI 47.19 kg/m    Physical Exam Vitals reviewed.  Constitutional:      General: She is not in acute distress.    Appearance: Normal appearance. She is obese. She is not ill-appearing.  HENT:     Head: Normocephalic and atraumatic.  Eyes:     General: No scleral icterus. Musculoskeletal:     Left lower leg: Swelling present. Edema present.     Left ankle: Swelling present.       Legs:  Skin:    Findings: Erythema (left lower leg) present.  Neurological:     Mental Status: She is alert and oriented to person, place, and time.  Psychiatric:        Mood and Affect: Mood normal.        Behavior: Behavior normal.       No results found for any visits on 12/14/19.  Assessment & Plan    1. Cellulitis of left lower extremity Will treat with Keflex as below. Call if not improving. Offered patient [place] to rule out DVT patient declined at this time but agree  to call back if not improved. Patient states if gets worse over weekend she will go to the ED.  - cephALEXin (KEFLEX) 500 MG capsule; Take 1 capsule (500 mg total) by mouth 2 (two) times daily.  Dispense: 14 capsule; Refill: 0  2. Generalized edema - furosemide (LASIX) 20 MG tablet; Take 1 tablet (20 mg total) by mouth daily.  Dispense: 30 tablet; Refill: 0  Return if symptoms worsen or fail to improve.      ITrinna Post, PA-C, have reviewed all documentation for this visit. The documentation on 12/14/19 for the exam, diagnosis, procedures, and orders are all accurate and complete.    Paulene Floor  [facility] [phone] (phone) [phone] (fax)  [place]

## 2020-01-30 NOTE — Progress Notes (Signed)
Complete physical exam   Patient: [name]   DOB: 25-Dec-1955   64 y.o. Female  MRN: [id number] Visit Date: 01/31/2020  Today's healthcare provider: Trinna Post, PA-C   Chief Complaint  Patient presents with  . Annual Exam  I,Amiera Herzberg M [place] Korver,acting as a scribe for Trinna Post, PA-C.,have documented all relevant documentation on the behalf of Trinna Post, PA-C,as directed by  Trinna Post, PA-C while in the presence of Trinna Post, PA-C.  Subjective    [name] is a 64 y.o. female who presents today for a complete physical exam.  She reports consuming a low carb diet. The patient does not participate in regular exercise at present. She generally feels well. She reports sleeping well. She does not have additional problems to discuss today.  HPI   She refuses PAP smear today. States that if she had cervical cancer she would not have anybody to take care of her. Reports that cervical cancer does not run in her family.   Last Colonoscopy in 2011-11-11 with Dr. Minna Merritts was normal.   Agreeable to mammogram.   No past medical history on file. Past Surgical History:  Procedure Laterality Date  . ABDOMINAL HYSTERECTOMY  Nov 11, 2006   Still has cervix  . APPENDECTOMY  1967  . BACK SURGERY     Ruptured disc.  Marland Kitchen BACK SURGERY  1985   Disc placed in back  . TONSILLECTOMY  1960   Social History   Socioeconomic History  . Marital status: Widowed    Spouse name: Not on file  . Number of children: 4  . Years of education: H/S  . Highest education level: Not on file  Occupational History    Comment: Full-time 50-60 hours weekly  Tobacco Use  . Smoking status: Never Smoker  . Smokeless tobacco: Never Used  Substance and Sexual Activity  . Alcohol use: No  . Drug use: No  . Sexual activity: Not on file  Other Topics Concern  . Not on file  Social History Narrative  . Not on file   Social Determinants of Health   Financial Resource Strain:   .  Difficulty of Paying Living Expenses:   Food Insecurity:   . Worried About Charity fundraiser in the Last Year:   . Arboriculturist in the Last Year:   Transportation Needs:   . Film/video editor (Medical):   Marland Kitchen Lack of Transportation (Non-Medical):   Physical Activity:   . Days of Exercise per Week:   . Minutes of Exercise per Session:   Stress:   . Feeling of Stress :   Social Connections:   . Frequency of Communication with Friends and Family:   . Frequency of Social Gatherings with Friends and Family:   . Attends Religious Services:   . Active Member of Clubs or Organizations:   . Attends Archivist Meetings:   Marland Kitchen Marital Status:   Intimate Partner Violence:   . Fear of Current or Ex-Partner:   . Emotionally Abused:   Marland Kitchen Physically Abused:   . Sexually Abused:    Family Status  Relation Name Status  . Mother  Deceased  . Father  Deceased at age 27       from a heart attack  . Sister  Alive  . Brother  Deceased       Died from [place] in 11-11-71  . MGM  Deceased at age 22  Died from Colon Cancer  . PGF  Deceased at age 74's       Heart Attack  . Brother  Alive   Family History  Problem Relation Age of Onset  . Bipolar disorder Mother   . Emphysema Mother   . Depression Mother   . Ulcers Mother   . Heart attack Father   . Hypertension Sister   . Edema Sister   . Colon cancer Maternal Grandmother   . Heart attack Paternal Grandfather   . Hypertension Brother   . Arthritis Brother    No Known Allergies  Patient Care Team: Paulene Floor as PCP - General (Physician Assistant)   Medications: Outpatient Medications Prior to Visit  Medication Sig  . ibuprofen (MOTRIN IB) 200 MG tablet Take by mouth.  . cephALEXin (KEFLEX) 500 MG capsule Take 1 capsule (500 mg total) by mouth 2 (two) times daily. (Patient not taking: Reported on 01/31/2020)  . furosemide (LASIX) 20 MG tablet Take by mouth. (Patient not taking: Reported on 01/31/2020)  .  furosemide (LASIX) 20 MG tablet Take 1 tablet (20 mg total) by mouth daily. (Patient not taking: Reported on 01/31/2020)  . KLOR-CON M10 10 MEQ tablet TAKE 1 TABLET BY MOUTH TWO TIMES DAILY (Patient not taking: Reported on 08/05/2016)  . MULTIPLE VITAMIN PO Take by mouth. (Patient not taking: Reported on 01/31/2020)  . Vitamin D, Ergocalciferol, (DRISDOL) 50000 UNITS CAPS capsule take 1 capsule by mouth every week (Patient not taking: Reported on 09/27/2018)   No facility-administered medications prior to visit.    Review of Systems  Constitutional: Negative.   HENT: Positive for ear pain.   Eyes: Negative.   Respiratory: Positive for shortness of breath.   Cardiovascular: Positive for leg swelling.  Gastrointestinal: Negative.   Endocrine: Negative.   Genitourinary: Negative.   Musculoskeletal: Negative.   Skin: Negative.   Allergic/Immunologic: Negative.   Neurological: Positive for headaches (mild per pt).  Hematological: Negative.   Psychiatric/Behavioral: Negative.       Objective    BP 138/78 (BP Location: Left Arm, Patient Position: Sitting, Cuff Size: Normal)   Pulse 90   Temp (!) 97.3 F (36.3 C) (Temporal)   Ht 5\' 6"  (1.676 m)   Wt 292 lb 6.4 oz (132.6 kg)   SpO2 96%   BMI 47.19 kg/m    Physical Exam Constitutional:      Appearance: Normal appearance.  HENT:     Left Ear: Tympanic membrane, ear canal and external ear normal.  Cardiovascular:     Rate and Rhythm: Normal rate and regular rhythm.     Pulses: Normal pulses.     Heart sounds: Normal heart sounds.  Pulmonary:     Effort: Pulmonary effort is normal.     Breath sounds: Normal breath sounds.  Abdominal:     General: Abdomen is flat. Bowel sounds are normal.     Palpations: Abdomen is soft.  Skin:    General: Skin is warm and dry.  Neurological:     General: No focal deficit present.     Mental Status: She is alert and oriented to person, place, and time.  Psychiatric:        Mood and Affect: Mood  normal.        Behavior: Behavior normal.       Last depression screening scores PHQ 2/9 Scores 01/31/2020 12/14/2019  PHQ - 2 Score 0 0  PHQ- 9 Score 1 -   Last fall risk screening  Fall Risk  01/31/2020  Falls in the past year? 0  Number falls in past yr: 0  Injury with Fall? 0  Risk for fall due to : -  Follow up -   Last Audit-C alcohol use screening Alcohol Use Disorder Test (AUDIT) 01/31/2020  1. How often do you have a drink containing alcohol? 0  2. How many drinks containing alcohol do you have on a typical day when you are drinking? 0  3. How often do you have six or more drinks on one occasion? 0  AUDIT-C Score 0   A score of 3 or more in women, and 4 or more in men indicates increased risk for alcohol abuse, EXCEPT if all of the points are from question 1   No results found for any visits on 01/31/20.  Assessment & Plan    Routine Health Maintenance and Physical Exam  Exercise Activities and Dietary recommendations Goals   None     Immunization History  Administered Date(s) Administered  . Influenza,inj,Quad PF,6+ Mos 05/12/2018  . Moderna SARS-COVID-2 Vaccination 10/13/2019, 11/10/2019  . Tdap 04/10/2009, 01/31/2020    Health Maintenance  Topic Date Due  . Hepatitis C Screening  Never done  . HIV Screening  Never done  . MAMMOGRAM  08/30/2013  . PAP SMEAR-Modifier  01/31/2020 (Originally 06/03/1977)  . INFLUENZA VACCINE  02/24/2020  . COLONOSCOPY  09/29/2021  . TETANUS/TDAP  01/30/2030  . COVID-19 Vaccine  Completed    Discussed health benefits of physical activity, and encouraged her to engage in regular exercise appropriate for her age and condition.  1. Annual physical exam  - Lipid panel - Comprehensive metabolic panel  2. Encounter for screening mammogram for malignant neoplasm of breast  - MM DIGITAL SCREENING BILATERAL  3. Need for diphtheria-tetanus-pertussis (Tdap) vaccine  - Tdap vaccine greater than or equal to 7yo IM  4.  Cervical cancer screening  Counseled patient cervical cancer is not genetic and largely due to HPV. Counseled that people who do not get PAPs routinely are at higher risk for cervical cancer and the purpose of the PAP smear is to catch abnormal cells before they turn into cervical cancer. Patient continues to refuse, states she will be 65 soon and she won't need it anymore. Informed 71 is the age to stop screening if she has been properly screened over the past ten years, which she has not.     Return in about 1 year (around 01/30/2021) for CPE.     ITrinna Post, PA-C, have reviewed all documentation for this visit. The documentation on 01/31/20 for the exam, diagnosis, procedures, and orders are all accurate and complete.    Paulene Floor  [facility] [phone] (phone) [phone] (fax)  [place]

## 2020-01-31 ENCOUNTER — Other Ambulatory Visit: Payer: Self-pay

## 2020-01-31 ENCOUNTER — Ambulatory Visit (INDEPENDENT_AMBULATORY_CARE_PROVIDER_SITE_OTHER): Payer: 59 | Admitting: Physician Assistant

## 2020-01-31 ENCOUNTER — Encounter: Payer: Self-pay | Admitting: Physician Assistant

## 2020-01-31 VITALS — BP 138/78 | HR 90 | Temp 97.3°F | Ht 66.0 in | Wt 292.4 lb

## 2020-01-31 DIAGNOSIS — Z23 Encounter for immunization: Secondary | ICD-10-CM

## 2020-01-31 DIAGNOSIS — Z0000 Encounter for general adult medical examination without abnormal findings: Secondary | ICD-10-CM | POA: Diagnosis not present

## 2020-01-31 DIAGNOSIS — Z124 Encounter for screening for malignant neoplasm of cervix: Secondary | ICD-10-CM

## 2020-01-31 DIAGNOSIS — Z1231 Encounter for screening mammogram for malignant neoplasm of breast: Secondary | ICD-10-CM

## 2020-01-31 NOTE — Patient Instructions (Signed)

## 2020-02-01 LAB — COMPREHENSIVE METABOLIC PANEL WITH GFR
ALT: 30 [IU]/L (ref 0–32)
AST: 24 [IU]/L (ref 0–40)
Albumin/Globulin Ratio: 1.8 (ref 1.2–2.2)
Albumin: 4.4 g/dL (ref 3.8–4.8)
Alkaline Phosphatase: 88 [IU]/L (ref 48–121)
BUN/Creatinine Ratio: 17 (ref 12–28)
BUN: 12 mg/dL (ref 8–27)
Bilirubin Total: 0.3 mg/dL (ref 0.0–1.2)
CO2: 22 mmol/L (ref 20–29)
Calcium: 10.1 mg/dL (ref 8.7–10.3)
Chloride: 105 mmol/L (ref 96–106)
Creatinine, Ser: 0.71 mg/dL (ref 0.57–1.00)
GFR calc Af Amer: 105 mL/min/{1.73_m2}
GFR calc non Af Amer: 91 mL/min/{1.73_m2}
Globulin, Total: 2.5 g/dL (ref 1.5–4.5)
Glucose: 96 mg/dL (ref 65–99)
Potassium: 4.7 mmol/L (ref 3.5–5.2)
Sodium: 141 mmol/L (ref 134–144)
Total Protein: 6.9 g/dL (ref 6.0–8.5)

## 2020-02-01 LAB — LIPID PANEL
Chol/HDL Ratio: 3.6 ratio (ref 0.0–4.4)
Cholesterol, Total: 197 mg/dL (ref 100–199)
HDL: 55 mg/dL
LDL Chol Calc (NIH): 124 mg/dL — ABNORMAL HIGH (ref 0–99)
Triglycerides: 100 mg/dL (ref 0–149)
VLDL Cholesterol Cal: 18 mg/dL (ref 5–40)

## 2020-02-11 ENCOUNTER — Other Ambulatory Visit: Payer: Self-pay | Admitting: Otolaryngology

## 2020-02-11 DIAGNOSIS — IMO0001 Reserved for inherently not codable concepts without codable children: Secondary | ICD-10-CM

## 2020-02-13 ENCOUNTER — Telehealth: Payer: Self-pay

## 2020-02-13 NOTE — Telephone Encounter (Signed)
Copied from [place] [id number]. Topic: General - Other >> Feb 13, 2020 10:37 AM Celene Kras wrote: Reason for CRM: Pt called and is requesting to have someone give her a call back with her lab results. Please advise.

## 2020-02-13 NOTE — Telephone Encounter (Signed)
Advised patient of results.  

## 2020-02-21 ENCOUNTER — Ambulatory Visit
Admission: RE | Admit: 2020-02-21 | Discharge: 2020-02-21 | Disposition: A | Discharge status: Home / Self Care | Payer: 59 | Source: Ambulatory Visit | Attending: Physician Assistant | Admitting: Physician Assistant

## 2020-02-21 DIAGNOSIS — Z1231 Encounter for screening mammogram for malignant neoplasm of breast: Secondary | ICD-10-CM | POA: Diagnosis not present

## 2020-03-07 ENCOUNTER — Other Ambulatory Visit: Payer: Self-pay

## 2020-03-07 ENCOUNTER — Ambulatory Visit
Admission: RE | Admit: 2020-03-07 | Discharge: 2020-03-07 | Disposition: A | Discharge status: Home / Self Care | Payer: 59 | Source: Ambulatory Visit | Attending: Otolaryngology | Admitting: Otolaryngology

## 2020-03-07 DIAGNOSIS — H9042 Sensorineural hearing loss, unilateral, left ear, with unrestricted hearing on the contralateral side: Secondary | ICD-10-CM | POA: Diagnosis not present

## 2020-03-07 DIAGNOSIS — IMO0001 Reserved for inherently not codable concepts without codable children: Secondary | ICD-10-CM

## 2020-03-07 MED ORDER — GADOBUTROL 1 MMOL/ML IV SOLN
10.0000 mL | Freq: Once | INTRAVENOUS | Status: AC | PRN
  Administered 2020-03-07: 10 mL via INTRAVENOUS

## 2020-07-14 ENCOUNTER — Encounter: Payer: Self-pay | Admitting: Family Medicine

## 2020-07-14 ENCOUNTER — Telehealth (INDEPENDENT_AMBULATORY_CARE_PROVIDER_SITE_OTHER): Payer: 59 | Admitting: Family Medicine

## 2020-07-14 ENCOUNTER — Ambulatory Visit
Admission: RE | Admit: 2020-07-14 | Discharge: 2020-07-14 | Disposition: A | Discharge status: Home / Self Care | Payer: 59 | Source: Ambulatory Visit | Attending: Family Medicine | Admitting: Family Medicine

## 2020-07-14 ENCOUNTER — Other Ambulatory Visit: Payer: Self-pay

## 2020-07-14 ENCOUNTER — Ambulatory Visit
Admission: RE | Admit: 2020-07-14 | Discharge: 2020-07-14 | Disposition: A | Discharge status: Home / Self Care | Payer: 59 | Attending: Family Medicine | Admitting: Family Medicine

## 2020-07-14 VITALS — Ht 66.0 in | Wt 288.0 lb

## 2020-07-14 DIAGNOSIS — J069 Acute upper respiratory infection, unspecified: Secondary | ICD-10-CM

## 2020-07-14 DIAGNOSIS — R053 Chronic cough: Secondary | ICD-10-CM | POA: Diagnosis not present

## 2020-07-14 NOTE — Patient Instructions (Signed)

## 2020-07-14 NOTE — Progress Notes (Signed)
MyChart Video Visit    Virtual Visit via Video Note   This visit type was conducted due to national recommendations for restrictions regarding the COVID-19 Pandemic (e.g. social distancing) in an effort to limit this patient's exposure and mitigate transmission in our community. This patient is at least at moderate risk for complications without adequate follow up. This format is felt to be most appropriate for this patient at this time. Physical exam was limited by quality of the video and audio technology used for the visit.   Patient location: home Provider location: [place] involved in the visit: patient, provider  I discussed the limitations of evaluation and management by telemedicine and the availability of in person appointments. The patient expressed understanding and agreed to proceed.  Interactive audio and video communications were attempted, although failed due to patient's inability to connect to video. Continued visit with audio only interaction with patient agreement.    Patient: [name]   DOB: 1955-11-23   64 y.o. Female  MRN: [id number] Visit Date: 07/14/2020  Today's healthcare provider: Lavon Paganini, MD   Chief Complaint  Patient presents with  . URI   Subjective    HPI  Upper respiratory symptoms She complains of congestion and cough described as nonproductive.with no fever, chills, night sweats or weight loss. Onset of symptoms was about a week ago and worsening.She is drinking plenty of fluids.  Past history is significant for no history of pneumonia or bronchitis. Patient is non-smoker   Now with some tightness and wheezing.  Worse at night.  Tried dayquil with no relief. Does have chronic sinusitis - followed by ENT. This is a changed from baseline.  Had [place] 11/23 and returned to work 12/3 and had returned to baseline except for slowly returning loss of taste and smell. Is vaccinated for flu and COVID  x2.  Has chronic cough x2 yrs.  Occurs after talking, drinking water, or eating a few bites.   ---------------------------------------------------------------------------------------------------    Patient Active Problem List   Diagnosis Date Noted  . Absolute anemia 02/06/2015  . Accumulation of fluid in tissues 02/06/2015  . Abnormal liver enzymes 02/06/2015  . Angioma 02/06/2015  . Hydronephrosis 02/06/2015  . Calcium blood increased 02/06/2015  . Hepatic cyst 02/06/2015  . Headache, migraine 02/06/2015  . Avitaminosis D 02/06/2015  . Avitaminosis 02/06/2015  . Hyponatremia 01/29/2015   Social History   Tobacco Use  . Smoking status: Never Smoker  . Smokeless tobacco: Never Used  Substance Use Topics  . Alcohol use: No  . Drug use: No   No Known Allergies    Medications: Outpatient Medications Prior to Visit  Medication Sig  . [DISCONTINUED] cephALEXin (KEFLEX) 500 MG capsule Take 1 capsule (500 mg total) by mouth 2 (two) times daily. (Patient not taking: Reported on 01/31/2020)  . [DISCONTINUED] furosemide (LASIX) 20 MG tablet Take by mouth. (Patient not taking: Reported on 01/31/2020)  . [DISCONTINUED] furosemide (LASIX) 20 MG tablet Take 1 tablet (20 mg total) by mouth daily. (Patient not taking: Reported on 01/31/2020)  . [DISCONTINUED] ibuprofen (MOTRIN IB) 200 MG tablet Take by mouth.  . [DISCONTINUED] KLOR-CON M10 10 MEQ tablet TAKE 1 TABLET BY MOUTH TWO TIMES DAILY (Patient not taking: Reported on 08/05/2016)  . [DISCONTINUED] MULTIPLE VITAMIN PO Take by mouth. (Patient not taking: Reported on 01/31/2020)  . [DISCONTINUED] Vitamin D, Ergocalciferol, (DRISDOL) 50000 UNITS CAPS capsule take 1 capsule by mouth every week (Patient not taking: Reported on 09/27/2018)  No facility-administered medications prior to visit.    Review of Systems  Constitutional: Negative for appetite change, chills, fever and unexpected weight change.  HENT: Positive for congestion.    Respiratory: Positive for cough, chest tightness, shortness of breath and wheezing.   Cardiovascular: Negative for chest pain and palpitations.    Last CBC Lab Results  Component Value Date   WBC 9.7 10/19/2018   HGB 13.7 10/19/2018   HCT 42.8 10/19/2018   MCV 83 10/19/2018   MCH 26.4 (L) 10/19/2018   RDW 14.4 10/19/2018   PLT 304 10/19/2018      Objective    Ht 5\' 6"  (1.676 m)   Wt 288 lb (130.6 kg)   BMI 46.48 kg/m  Wt Readings from Last 3 Encounters:  07/14/20 288 lb (130.6 kg)  01/31/20 292 lb 6.4 oz (132.6 kg)  12/14/19 292 lb 6.4 oz (132.6 kg)      Physical Exam  Speaking in full sentences in NAD   Assessment & Plan      1. Viral URI with cough - symptoms and exam c/w viral URI - no symptoms of strep pharyngitis, AOM, bacterial sinusitis - given that she had COVID last month, do not recommend retesting for COVID at this time - do encourage COVID booster - given adventitious lung sounds and tightness, recommend CXR - discussed symptomatic management, natural course, and return precautions  - DG Chest 2 View; Future   2. Chronic Cough - ongoing for 2 yr - different than above URI - consider GERD given that food exacerbates it - recommend trial of PPI - discussed return precautions   Return if symptoms worsen or fail to improve.     I discussed the assessment and treatment plan with the patient. The patient was provided an opportunity to ask questions and all were answered. The patient agreed with the plan and demonstrated an understanding of the instructions.   The patient was advised to call back or seek an in-person evaluation if the symptoms worsen or if the condition fails to improve as anticipated.  I provided 15 minutes of non-face-to-face time during this encounter.  I, Lavon Paganini, MD, have reviewed all documentation for this visit. The documentation on 07/14/20 for the exam, diagnosis, procedures, and orders are all accurate and  complete.   Miliani Deike, Dionne Bucy, MD, MPH [facility]

## 2020-07-15 ENCOUNTER — Telehealth: Payer: Self-pay

## 2020-07-15 DIAGNOSIS — J069 Acute upper respiratory infection, unspecified: Secondary | ICD-10-CM

## 2020-07-15 DIAGNOSIS — R053 Chronic cough: Secondary | ICD-10-CM

## 2020-07-15 MED ORDER — DOXYCYCLINE HYCLATE 100 MG PO TABS: 100.0000 mg | ORAL_TABLET | Freq: Two times a day (BID) | ORAL | 0 refills | Status: DC

## 2020-07-15 NOTE — Telephone Encounter (Signed)
-----   Message from Virginia Crews, MD sent at 07/15/2020  1:48 PM EST ----- She has bilateral base opacities. This could represent pneumonia and with current symptoms I think treatment is appropriate. Recommend Doxycycline 100mg  BID x7 d #14 r0. Please send in Rx if patient agrees.

## 2020-07-15 NOTE — Telephone Encounter (Signed)
Patient advised as below. Patient verbalizes understanding and is in agreement with treatment plan.  

## 2020-07-29 ENCOUNTER — Telehealth: Payer: Self-pay

## 2020-07-29 NOTE — Telephone Encounter (Signed)
Has she had covid testing?  I don't see a result.  If symptoms present >10 days and this is not a new illness, ok to be seen in person.

## 2020-07-29 NOTE — Telephone Encounter (Signed)
Copied from CRM [id number]. Topic: Appointment Scheduling - Scheduling Inquiry for Clinic >> Jul 29, 2020  1:08 PM Leafy Ro wrote: Reason for CRM: Pt had telephone  visit with dr b on 07-15-2020. Pt is still having cough and losing her voice. Pt would like to have in office appt. Please call

## 2020-07-30 NOTE — Telephone Encounter (Signed)
Pt states her cough has been chronic.  Apt scheduled for 08/01/2020 at 8:40.  Thanks,   -Vernona Rieger

## 2020-07-31 ENCOUNTER — Other Ambulatory Visit: Payer: Self-pay

## 2020-07-31 ENCOUNTER — Ambulatory Visit
Admission: RE | Admit: 2020-07-31 | Discharge: 2020-07-31 | Disposition: A | Discharge status: Home / Self Care | Payer: 59 | Source: Ambulatory Visit | Attending: Family Medicine | Admitting: Family Medicine

## 2020-07-31 ENCOUNTER — Ambulatory Visit
Admission: RE | Admit: 2020-07-31 | Discharge: 2020-07-31 | Disposition: A | Discharge status: Home / Self Care | Payer: 59 | Attending: Family Medicine | Admitting: Family Medicine

## 2020-07-31 ENCOUNTER — Ambulatory Visit: Payer: 59 | Admitting: Family Medicine

## 2020-07-31 ENCOUNTER — Encounter: Payer: Self-pay | Admitting: Family Medicine

## 2020-07-31 VITALS — BP 194/89 | HR 93 | Temp 98.4°F | Wt 301.0 lb

## 2020-07-31 DIAGNOSIS — R609 Edema, unspecified: Secondary | ICD-10-CM | POA: Diagnosis not present

## 2020-07-31 DIAGNOSIS — R053 Chronic cough: Secondary | ICD-10-CM | POA: Diagnosis not present

## 2020-07-31 DIAGNOSIS — R0609 Other forms of dyspnea: Secondary | ICD-10-CM

## 2020-07-31 DIAGNOSIS — R0600 Dyspnea, unspecified: Secondary | ICD-10-CM | POA: Insufficient documentation

## 2020-07-31 MED ORDER — ASMANEX HFA 100 MCG/ACT IN AERO: 2.0000 | INHALATION_SPRAY | Freq: Two times a day (BID) | RESPIRATORY_TRACT | 0 refills | Status: DC

## 2020-07-31 NOTE — Progress Notes (Signed)
Acute Office Visit  Subjective:    Patient ID: [name], female    DOB: [birth year], 65 y.o.   MRN: [id number]  No chief complaint on file.   HPI Patient is in today for evaluation of persistent cough. She states that she has had it for several years with worsening over the last few months.  She had been treated for pneumonia on 07/15/20.  She states that she only coughs when she is talking and does not cough any at night.   No past medical history on file.  Past Surgical History:  Procedure Laterality Date  . ABDOMINAL HYSTERECTOMY  2008   Still has cervix  . APPENDECTOMY  1967  . BACK SURGERY     Ruptured disc.  Marland Kitchen BACK SURGERY  1985   Disc placed in back  . TONSILLECTOMY  1960    Family History  Problem Relation Age of Onset  . Bipolar disorder Mother   . Emphysema Mother   . Depression Mother   . Ulcers Mother   . Heart attack Father   . Hypertension Sister   . Edema Sister   . Colon cancer Maternal Grandmother   . Heart attack Paternal Grandfather   . Hypertension Brother   . Arthritis Brother     Social History   Socioeconomic History  . Marital status: Widowed    Spouse name: Not on file  . Number of children: 4  . Years of education: H/S  . Highest education level: Not on file  Occupational History    Comment: Full-time 50-60 hours weekly  Tobacco Use  . Smoking status: Never Smoker  . Smokeless tobacco: Never Used  Substance and Sexual Activity  . Alcohol use: No  . Drug use: No  . Sexual activity: Not on file  Other Topics Concern  . Not on file  Social History Narrative  . Not on file   Social Determinants of Health   Financial Resource Strain: Not on file  Food Insecurity: Not on file  Transportation Needs: Not on file  Physical Activity: Not on file  Stress: Not on file  Social Connections: Not on file  Intimate Partner Violence: Not on file    Outpatient Medications Prior to Visit  Medication Sig Dispense Refill  .  doxycycline (VIBRA-TABS) 100 MG tablet Take 1 tablet (100 mg total) by mouth 2 (two) times daily. 14 tablet 0   No facility-administered medications prior to visit.    No Known Allergies  Review of Systems  Constitutional: Negative for fever.  HENT: Negative for ear discharge, ear pain, postnasal drip, rhinorrhea, sinus pressure, sinus pain, sneezing and sore throat.   Respiratory: Positive for shortness of breath (when goes up stairs). Negative for wheezing. Cough: non productive.   Neurological: Negative for dizziness and headaches.       Objective:    Physical Exam Constitutional:      Appearance: She is well-developed and well-nourished.  HENT:     Head: Normocephalic and atraumatic.     Right Ear: Hearing and tympanic membrane normal.     Left Ear: Hearing and tympanic membrane normal.     Nose: Nose normal.     Mouth/Throat:     Mouth: Mucous membranes are moist.     Pharynx: Oropharynx is clear.  Eyes:     General: Lids are normal. No scleral icterus.       Right eye: No discharge.        Left eye: No discharge.  Conjunctiva/sclera: Conjunctivae normal.  Cardiovascular:     Rate and Rhythm: Normal rate and regular rhythm.  Pulmonary:     Effort: Pulmonary effort is normal.     Comments: No audible wheeze but some dyspnea after talking awhile or with exertion. No rales or rhonchi. Abdominal:     General: Bowel sounds are normal.     Palpations: Abdomen is soft.  Musculoskeletal:        General: Normal range of motion.     Cervical back: Neck supple.  Skin:    General: Skin is intact.     Findings: No lesion or rash.  Neurological:     Mental Status: She is alert and oriented to person, place, and time.  Psychiatric:        Mood and Affect: Mood and affect normal.        Speech: Speech normal.        Behavior: Behavior normal.        Thought Content: Thought content normal.     BP (!) 194/89 (BP Location: Right Arm, Patient Position: Sitting, Cuff  Size: Normal)   Pulse 93   Temp 98.4 F (36.9 C) (Oral)   Wt (!) 301 lb (136.5 kg)   SpO2 99%   BMI 48.58 kg/m  Wt Readings from Last 3 Encounters:  07/14/20 288 lb (130.6 kg)  01/31/20 292 lb 6.4 oz (132.6 kg)  12/14/19 292 lb 6.4 oz (132.6 kg)    Health Maintenance Due  Topic Date Due  . Hepatitis C Screening  Never done  . HIV Screening  Never done  . PAP SMEAR-Modifier  Never done  . INFLUENZA VACCINE  02/24/2020  . COVID-19 Vaccine (3 - Booster for Moderna series) 05/11/2020    There are no preventive care reminders to display for this patient.   Lab Results  Component Value Date   TSH 2.940 10/19/2018   Lab Results  Component Value Date   WBC 9.7 10/19/2018   HGB 13.7 10/19/2018   HCT 42.8 10/19/2018   MCV 83 10/19/2018   PLT 304 10/19/2018   Lab Results  Component Value Date   NA 141 01/31/2020   K 4.7 01/31/2020   CO2 22 01/31/2020   GLUCOSE 96 01/31/2020   BUN 12 01/31/2020   CREATININE 0.71 01/31/2020   BILITOT 0.3 01/31/2020   ALKPHOS 88 01/31/2020   AST 24 01/31/2020   ALT 30 01/31/2020   PROT 6.9 01/31/2020   ALBUMIN 4.4 01/31/2020   CALCIUM 10.1 01/31/2020   ANIONGAP 5 (L) 11/03/2012   Lab Results  Component Value Date   CHOL 197 01/31/2020   Lab Results  Component Value Date   HDL 55 01/31/2020   Lab Results  Component Value Date   LDLCALC 124 (H) 01/31/2020   Lab Results  Component Value Date   TRIG 100 01/31/2020   Lab Results  Component Value Date   CHOLHDL 3.6 01/31/2020   No results found for: HGBA1C     Assessment & Plan:   1. Chronic cough Has had a chronic cough return over the past couple weeks. No fever or sputum production. Was treated for bibasilar opacities/pneumonia with antibiotic on 07-15-20. States the cough and dyspnea has been present for several months. Note some pitting edema of lower legs. Will check labs and BNP with follow up CXR. May need pulmonology or cardiology referral pending reports. Given  sample of Asmanex-HFA 100 mcg/act 2 puffs BID for any bronchospasm and inflammation. - CBC with  Differential/Platelet - Comprehensive metabolic panel - B Nat Peptide - DG Chest 2 View  2. Peripheral edema Both lower legs have significant edema. States use of Furosemide in the past very little help. Check labs to rule out CHF. - CBC with Differential/Platelet - Comprehensive metabolic panel - B Nat Peptide - DG Chest 2 View  3. Dyspnea on exertion Short of breath with exertion and sleeps in a recliner. Check CXR - CBC with Differential/Platelet - Comprehensive metabolic panel - B Nat Peptide - DG Chest 2 View    No orders of the defined types were placed in this encounter.  I, Katye Valek, PA-C, have reviewed all documentation for this visit. The documentation on 07/31/20 for the exam, diagnosis, procedures, and orders are all accurate and complete.  Juluis Mire, CMA

## 2020-08-01 ENCOUNTER — Ambulatory Visit: Payer: 59 | Admitting: Physician Assistant

## 2020-08-02 LAB — COMPREHENSIVE METABOLIC PANEL
ALT: 38 IU/L — ABNORMAL HIGH (ref 0–32)
AST: 23 IU/L (ref 0–40)
Albumin/Globulin Ratio: 1.8 (ref 1.2–2.2)
Albumin: 4.3 g/dL (ref 3.8–4.8)
Alkaline Phosphatase: 93 IU/L (ref 44–121)
BUN/Creatinine Ratio: 18 (ref 12–28)
BUN: 11 mg/dL (ref 8–27)
Bilirubin Total: 0.3 mg/dL (ref 0.0–1.2)
CO2: 23 mmol/L (ref 20–29)
Calcium: 10.4 mg/dL — ABNORMAL HIGH (ref 8.7–10.3)
Chloride: 105 mmol/L (ref 96–106)
Creatinine, Ser: 0.62 mg/dL (ref 0.57–1.00)
GFR calc Af Amer: 110 mL/min/{1.73_m2} (ref >59)
GFR calc non Af Amer: 96 mL/min/{1.73_m2} (ref >59)
Globulin, Total: 2.4 g/dL (ref 1.5–4.5)
Glucose: 94 mg/dL (ref 65–99)
Potassium: 5.2 mmol/L (ref 3.5–5.2)
Sodium: 141 mmol/L (ref 134–144)
Total Protein: 6.7 g/dL (ref 6.0–8.5)

## 2020-08-02 LAB — CBC WITH DIFFERENTIAL/PLATELET
Basophils Absolute: 0.1 10*3/uL (ref 0.0–0.2)
Basos: 1 %
EOS (ABSOLUTE): 0.2 10*3/uL (ref 0.0–0.4)
Eos: 3 %
Hematocrit: 41.8 % (ref 34.0–46.6)
Hemoglobin: 13.4 g/dL (ref 11.1–15.9)
Immature Grans (Abs): 0 10*3/uL (ref 0.0–0.1)
Immature Granulocytes: 0 %
Lymphocytes Absolute: 2.6 10*3/uL (ref 0.7–3.1)
Lymphs: 31 %
MCH: 26.6 pg (ref 26.6–33.0)
MCHC: 32.1 g/dL (ref 31.5–35.7)
MCV: 83 fL (ref 79–97)
Monocytes Absolute: 1.1 10*3/uL — ABNORMAL HIGH (ref 0.1–0.9)
Monocytes: 13 %
Neutrophils Absolute: 4.5 10*3/uL (ref 1.4–7.0)
Neutrophils: 52 %
Platelets: 287 10*3/uL (ref 150–450)
RBC: 5.03 x10E6/uL (ref 3.77–5.28)
RDW: 13.9 % (ref 11.7–15.4)
WBC: 8.6 10*3/uL (ref 3.4–10.8)

## 2020-08-02 LAB — BRAIN NATRIURETIC PEPTIDE: BNP: 7.3 pg/mL (ref 0.0–100.0)

## 2020-08-05 ENCOUNTER — Other Ambulatory Visit: Payer: Self-pay | Admitting: Family Medicine

## 2020-08-05 DIAGNOSIS — R053 Chronic cough: Secondary | ICD-10-CM

## 2020-08-07 ENCOUNTER — Encounter: Payer: Self-pay | Admitting: Pulmonary Disease

## 2020-08-07 ENCOUNTER — Ambulatory Visit: Payer: 59 | Admitting: Pulmonary Disease

## 2020-08-07 ENCOUNTER — Other Ambulatory Visit: Payer: Self-pay

## 2020-08-07 VITALS — BP 148/88 | HR 98 | Temp 97.3°F | Ht 66.0 in | Wt 296.2 lb

## 2020-08-07 DIAGNOSIS — E8770 Fluid overload, unspecified: Secondary | ICD-10-CM | POA: Diagnosis not present

## 2020-08-07 DIAGNOSIS — R0602 Shortness of breath: Secondary | ICD-10-CM | POA: Diagnosis not present

## 2020-08-07 DIAGNOSIS — R0982 Postnasal drip: Secondary | ICD-10-CM | POA: Diagnosis not present

## 2020-08-07 DIAGNOSIS — R059 Cough, unspecified: Secondary | ICD-10-CM

## 2020-08-07 DIAGNOSIS — J329 Chronic sinusitis, unspecified: Secondary | ICD-10-CM

## 2020-08-07 DIAGNOSIS — R011 Cardiac murmur, unspecified: Secondary | ICD-10-CM

## 2020-08-07 MED ORDER — TORSEMIDE 20 MG PO TABS: 20.0000 mg | ORAL_TABLET | Freq: Every day | ORAL | 0 refills | Status: DC

## 2020-08-07 MED ORDER — AZELASTINE-FLUTICASONE 137-50 MCG/ACT NA SUSP: 1.0000 | Freq: Two times a day (BID) | NASAL | 1 refills | Status: DC

## 2020-08-07 MED ORDER — AZELASTINE-FLUTICASONE 137-50 MCG/ACT NA SUSP: 1.0000 | Freq: Two times a day (BID) | NASAL | 2 refills | Status: DC

## 2020-08-07 NOTE — Progress Notes (Signed)
Subjective:    Patient ID: [name], female    DOB: 10-Jan-1956, 65 y.o.   MRN: [id number]  HPI Patient is a 65 year old lifelong never smoker who presents for evaluation of dyspnea particularly when walking fast or taking stairs for over a years duration.  She is kindly referred by [employer].  Patient also has noted a dry cough, no sputum production, no hemoptysis.  Cough has been present only for a few weeks.  She has noted increased postnasal drip and nasal congestion.  She does have evidence of chronic sinusitis on prior MRI of the brain performed in August 2021.  She has not had any fevers, chills or sweats.  No purulent nasal discharge.  No reflux symptoms per se though she has occasional dyspepsia.  She has noted lower extremity edema also for approximately a year.  She has noted that furosemide does not help her.  She has had occasional tachypalpitations.  No chest pain though occasionally will "stitch" with cough.  Has not noticed orthopnea however she does not lay supine when she sleeps.  No paroxysmal nocturnal dyspnea.  She had COVID-19 in November 2021 but the symptoms preceded that infection.  She started Asmanex approximately 1 week ago and has noticed perhaps some improvement in her cough however she is not sure if she is using the inhaler correctly.  She does not have any occupational exposure, she works as a Neurosurgeon.  No unusual hobbies.  No exposure to exotic pets.  No birds in the home.   Review of Systems A 10 point review of systems was performed and it is as noted above otherwise negative.  Past Medical History:  Diagnosis Date  . Chronic sinusitis   . Hypertension   . Migraines   . Morbid obesity ([place])   . Sensorineural hearing loss    Past Surgical History:  Procedure Laterality Date  . ABDOMINAL HYSTERECTOMY  2008   Still has cervix  . APPENDECTOMY  1967  . BACK SURGERY     Ruptured disc.  [facility] BACK SURGERY  1985   Disc placed in back   . TONSILLECTOMY  1960   Patient Active Problem List   Diagnosis Date Noted  . Absolute anemia 02/06/2015  . Accumulation of fluid in tissues 02/06/2015  . Abnormal liver enzymes 02/06/2015  . Angioma 02/06/2015  . Hydronephrosis 02/06/2015  . Calcium blood increased 02/06/2015  . Hepatic cyst 02/06/2015  . Headache, migraine 02/06/2015  . Avitaminosis D 02/06/2015  . Avitaminosis 02/06/2015  . Hyponatremia 01/29/2015   Family History  Problem Relation Age of Onset  . Bipolar disorder Mother   . Emphysema Mother   . Depression Mother   . Ulcers Mother   . Heart attack Father   . Hypertension Sister   . Edema Sister   . Colon cancer Maternal Grandmother   . Heart attack Paternal Grandfather   . Hypertension Brother   . Arthritis Brother    Social History   Tobacco Use  . Smoking status: Never Smoker  . Smokeless tobacco: Never Used  Substance Use Topics  . Alcohol use: No   No Known Allergies  Current Meds  Medication Sig  . Mometasone Furoate (ASMANEX HFA) 100 MCG/ACT AERO Inhale 2 Act into the lungs 2 (two) times daily.   Immunization History  Administered Date(s) Administered  . Influenza,inj,Quad PF,6+ Mos 05/12/2018  . Moderna SARS-COV2 Booster Vaccination 07/14/2020  . Moderna Sars-Covid-2 Vaccination 10/13/2019, 11/10/2019  . Tdap 04/10/2009, 01/31/2020  Objective:   Physical Exam BP (!) 148/88 (BP Location: Left Arm, Cuff Size: Large)   Pulse 98   Temp (!) 97.3 F (36.3 C) (Temporal)   Ht _0  (1.676 m)   Wt 296 lb 3.2 oz (134.4 kg)   SpO2 95%   BMI 47.81 kg/m  GENERAL: Morbidly obese woman, no acute distress no conversational dyspnea, fully ambulatory. HEAD: Normocephalic, atraumatic.  EYES: Pupils equal, round, reactive to light.  No scleral icterus.  MOUTH: Nose/mouth/throat not examined due to masking requirements for COVID 19. NECK: Supple. No thyromegaly. Trachea midline. No JVD.  No adenopathy. PULMONARY: Good air entry  bilaterally.  No adventitious sounds. CARDIOVASCULAR: S1 and S2. Regular rate and rhythm.  Grade 2/6 systolic ejection murmur over the aortic area. ABDOMEN: Obese, benign. MUSCULOSKELETAL: No joint deformity, no clubbing, there is 2+ to 3+ edema of the lower extremities to the level of mid shin.  No calf tenderness. NEUROLOGIC: No overt focal deficit, speech is fluent, no gait disturbance. SKIN: Intact,warm,dry.  Mental exam no rashes. PSYCH: Mood and behavior normal.  Recent Results (from the past 2160 hour(s))  CBC with Differential/Platelet     Status: Abnormal   Collection Time: 08/01/20  1:52 PM  Result Value Ref Range   WBC 8.6 3.4 - 10.8 x10E3/uL   RBC 5.03 3.77 - 5.28 x10E6/uL   Hemoglobin 13.4 11.1 - 15.9 g/dL   Hematocrit 41.8 34.0 - 46.6 %   MCV 83 79 - 97 fL   MCH 26.6 26.6 - 33.0 pg   MCHC 32.1 31.5 - 35.7 g/dL   RDW 13.9 11.7 - 15.4 %   Platelets 287 150 - 450 x10E3/uL   Neutrophils 52 Not Estab. %   Lymphs 31 Not Estab. %   Monocytes 13 Not Estab. %   Eos 3 Not Estab. %   Basos 1 Not Estab. %   Neutrophils Absolute 4.5 1.4 - 7.0 x10E3/uL   Lymphocytes Absolute 2.6 0.7 - 3.1 x10E3/uL   Monocytes Absolute 1.1 (H) 0.1 - 0.9 x10E3/uL   EOS (ABSOLUTE) 0.2 0.0 - 0.4 x10E3/uL   Basophils Absolute 0.1 0.0 - 0.2 x10E3/uL   Immature Granulocytes 0 Not Estab. %   Immature Grans (Abs) 0.0 0.0 - 0.1 x10E3/uL  Comprehensive metabolic panel     Status: Abnormal   Collection Time: 08/01/20  1:52 PM  Result Value Ref Range   Glucose 94 65 - 99 mg/dL   BUN 11 8 - 27 mg/dL   Creatinine, Ser 0.62 0.57 - 1.00 mg/dL   GFR calc non Af Amer 96 >59 mL/min/1.73   GFR calc Af Amer 110 >59 mL/min/1.73    Comment: **In accordance with recommendations from the NKF-ASN Task force,**   [facility] is in the process of updating its eGFR calculation to the   2021 CKD-EPI creatinine equation that estimates kidney function   without a race variable.    BUN/Creatinine Ratio 18 12 - 28   Sodium  141 134 - 144 mmol/L   Potassium 5.2 3.5 - 5.2 mmol/L   Chloride 105 96 - 106 mmol/L   CO2 23 20 - 29 mmol/L   Calcium 10.4 (H) 8.7 - 10.3 mg/dL   Total Protein 6.7 6.0 - 8.5 g/dL   Albumin 4.3 3.8 - 4.8 g/dL   Globulin, Total 2.4 1.5 - 4.5 g/dL   Albumin/Globulin Ratio 1.8 1.2 - 2.2   Bilirubin Total 0.3 0.0 - 1.2 mg/dL   Alkaline Phosphatase 93 44 - 121 IU/L  Comment:               **Please note reference interval change**   AST 23 0 - 40 IU/L   ALT 38 (H) 0 - 32 IU/L  B Nat Peptide     Status: None   Collection Time: 08/01/20  1:52 PM  Result Value Ref Range   BNP 7.3 0.0 - 100.0 pg/mL   Chest x-ray performed 31 July 2020, no change from prior films, subtle interstitial prominence:     Assessment & Plan:     ICD-10-CM   1. Cough  R05.9    This may be related to postnasal drip Other possibilities include volume overload Continue Asmanex, spacer provided Cannot exclude GERD  2. Shortness of breath  R06.02 Pulmonary Function Test [facility] Only   Volume overload Query reactive airways disease PFTs/2D echo Query obesity/obesity hypoventilation  3. Post-nasal discharge  R09.82    Afrin nasal spray 1 spray each nostril twice daily x3 days Dymista 1 spray to each nostril twice a day May need to revisit with ENT  4. Edema due to hypervolemia  E87.70    Demadex 20 mg daily Curtail use of excessive salt 2D echo  5. Murmur  R01.1 ECHOCARDIOGRAM COMPLETE   2D echo  6. Chronic sinusitis, unspecified location  J32.9    May need to revisit with ENT  7. Morbid obesity ([place])  E66.01    This issue adds complexity to her management Weight loss recommended   Orders Placed This Encounter  Procedures  . Pulmonary Function Test [facility] Only    Standing Status:   Future    Standing Expiration Date:   08/07/2021    Scheduling Instructions:     Next available    Order Specific Question:   Full PFT: includes the following: basic spirometry, spirometry pre & post bronchodilator, diffusion  capacity (DLCO), lung volumes    Answer:   Full PFT  . ECHOCARDIOGRAM COMPLETE    Standing Status:   Future    Standing Expiration Date:   02/04/2021    Scheduling Instructions:     Next available    Order Specific Question:   Where should this test be performed    Answer:   [facility]    Order Specific Question:   Perflutren DEFINITY (image enhancing agent) should be administered unless hypersensitivity or allergy exist    Answer:   Administer Perflutren    Order Specific Question:   Reason for exam-Echo    Answer:   Murmur  785.2 / R01.1   Meds ordered this encounter  Medications  . torsemide (DEMADEX) 20 MG tablet    Sig: Take 1 tablet (20 mg total) by mouth daily.    Dispense:  30 tablet    Refill:  0  . Azelastine-Fluticasone (DYMISTA) 137-50 MCG/ACT SUSP    Sig: Place 1 puff into the nose in the morning and at bedtime.    Dispense:  23 g    Refill:  2  . Azelastine-Fluticasone (DYMISTA) 137-50 MCG/ACT SUSP    Sig: Place 1 spray into the nose in the morning and at bedtime.    Dispense:  23 g    Refill:  1   Discussion:  Patient has had shortness of breath of approximately 8 years duration.  She also has noted significant edema over that time.  She exhibits significant lower extremity edema today as well as a cardiac murmur.  Need to exclude potential cardiac etiologies.  A 2D echo will be  obtained.  In addition her cough may be related to postnasal drip with reactive airways disease.  I have recommended that she continue Asmanex, she was provided a spacer to deliver the medication effectively.  She is to let us know if this is effective.  Though her chest x-ray shows potential interstitial changes this could be related to body habitus (breast shadow) PFTs will help determine whether further evaluation for interstitial lung disease needs to be made.  Will await results of those.  Additionally PFTs will reveal if she has airways reactivity.  Patiently the patient has morbid  obesity and it is recommended that she embark in weight loss program.  We will see the patient in follow-up in 3 to 4 weeks time she is to contact us prior to that time should any new difficulties arise.  Renold Don, MD [facility]   *This note was dictated using voice recognition software/Dragon.  Despite best efforts to proofread, errors can occur which can change the meaning.  Any change was purely unintentional.

## 2020-08-07 NOTE — Patient Instructions (Signed)
Your cough may be related to what would call upper airway cough syndrome which is usually related to postnasal drip particularly in individuals with chronic sinusitis.  I recommend Afrin spray over-the-counter 1 spray to each nostril twice a day for no more than 3 days.  You have been prescribed a nasal spray that spray should be used 1 spray to each nostril after the Afrin.  Continue using that spray after you have completed the 3 days of Afrin.  Continue using your Asmanex 2 puffs twice a day, make sure you rinse your mouth well after you use it.  We are providing you with a spacer to help you deliver the medication to your lungs.  This is a good medication for the type of cough you have.  Another reason for the cough could be excessive fluid in your body.  You have been prescribed a medication called torsemide or Demadex, this is 1 tablet daily for fluid.  We are ordering breathing tests and a heart test.  Will see him in follow-up in 3 to 4 weeks time with either me or the nurse practitioner.

## 2020-08-08 ENCOUNTER — Telehealth: Payer: Self-pay | Admitting: Pulmonary Disease

## 2020-08-08 ENCOUNTER — Other Ambulatory Visit: Payer: 59

## 2020-08-08 NOTE — Telephone Encounter (Signed)
Spoke to patient, who stated that Dymista is not covered by insurance.  I have attempted to contact [facility] for PA request and was placed on hold for >5min.  Will call back.

## 2020-08-08 NOTE — Telephone Encounter (Signed)
Spoke to [employer] with [employer] and requested that she fax PA request to our office. [name]

## 2020-08-08 NOTE — Telephone Encounter (Signed)
PA request has been received from pharmacy. PA has been started with [facility] at [facility]. Will await determination.  PA # [id number]

## 2020-08-12 MED ORDER — AZELASTINE HCL 0.15 % NA SOLN: 1.0000 | Freq: Two times a day (BID) | NASAL | 2 refills | Status: DC

## 2020-08-12 NOTE — Telephone Encounter (Signed)
PA for Dymista has been denied.  Per Dr. Jacqualine Code- send in Rx for Azelastine 0.15%-1 spray each nostril BID 87ml with 2 refill.  Rx has been sent to preferred pharmacy.  Patient is aware and voiced her understanding.  Nothing further needed.

## 2020-08-21 ENCOUNTER — Other Ambulatory Visit: Payer: 59

## 2020-08-22 ENCOUNTER — Other Ambulatory Visit: Payer: Self-pay

## 2020-08-22 ENCOUNTER — Ambulatory Visit (INDEPENDENT_AMBULATORY_CARE_PROVIDER_SITE_OTHER): Payer: 59

## 2020-08-22 ENCOUNTER — Telehealth: Payer: Self-pay

## 2020-08-22 ENCOUNTER — Ambulatory Visit: Payer: 59

## 2020-08-22 DIAGNOSIS — R011 Cardiac murmur, unspecified: Secondary | ICD-10-CM

## 2020-08-22 LAB — ECHOCARDIOGRAM COMPLETE
AR max vel: 2.48 cm2
AV Area VTI: 2.62 cm2
AV Area mean vel: 2.64 cm2
AV Mean grad: 9 mmHg
AV Peak grad: 15.5 mmHg
Ao pk vel: 1.97 m/s
Area-P 1/2: 4.15 cm2

## 2020-08-22 MED ORDER — PERFLUTREN LIPID MICROSPHERE
1.0000 mL | INTRAVENOUS | Status: AC | PRN
  Administered 2020-08-22: 2 mL via INTRAVENOUS

## 2020-08-22 NOTE — Telephone Encounter (Signed)
I have CXL PFT for today 08/20/20 since RT is not working. I have spoke with Mrs. Habib and she is aware of the new PFT appt on 08/29/2020 @ 1:30pm

## 2020-08-22 NOTE — Telephone Encounter (Signed)
Received message from [facility], RT.  PFT will need to be rescheduled today, due to RT being out sick.  Rodena Piety, please advise. Thanks

## 2020-08-29 ENCOUNTER — Ambulatory Visit: Payer: 59 | Attending: Pulmonary Disease

## 2020-08-29 ENCOUNTER — Other Ambulatory Visit: Payer: Self-pay

## 2020-08-29 DIAGNOSIS — R0602 Shortness of breath: Secondary | ICD-10-CM | POA: Insufficient documentation

## 2020-08-29 DIAGNOSIS — E669 Obesity, unspecified: Secondary | ICD-10-CM | POA: Insufficient documentation

## 2020-08-29 LAB — PULMONARY FUNCTION TEST ARMC ONLY
DL/VA % pred: 132 %
DL/VA: 5.45 ml/min/mmHg/L
DLCO unc % pred: 106 %
DLCO unc: 22.67 ml/min/mmHg
FEF 25-75 Post: 2.45 L/sec
FEF 25-75 Pre: 2.06 L/sec
FEF2575-%Change-Post: 18 %
FEF2575-%Pred-Post: 106 %
FEF2575-%Pred-Pre: 90 %
FEV1-%Change-Post: 2 %
FEV1-%Pred-Post: 74 %
FEV1-%Pred-Pre: 73 %
FEV1-Post: 1.98 L
FEV1-Pre: 1.93 L
FEV1FVC-%Change-Post: 2 %
FEV1FVC-%Pred-Pre: 108 %
FEV6-%Change-Post: 0 %
FEV6-%Pred-Post: 69 %
FEV6-%Pred-Pre: 69 %
FEV6-Post: 2.31 L
FEV6-Pre: 2.31 L
FEV6FVC-%Pred-Post: 103 %
FEV6FVC-%Pred-Pre: 103 %
FVC-%Change-Post: 0 %
FVC-%Pred-Post: 67 %
FVC-%Pred-Pre: 67 %
FVC-Post: 2.31 L
Post FEV1/FVC ratio: 86 %
Post FEV6/FVC ratio: 100 %
Pre FEV1/FVC ratio: 84 %
Pre FEV6/FVC Ratio: 100 %
RV % pred: 81 %
RV: 1.77 L
TLC % pred: 80 %
TLC: 4.31 L

## 2020-08-29 MED ORDER — ALBUTEROL SULFATE (2.5 MG/3ML) 0.083% IN NEBU
2.5000 mg | INHALATION_SOLUTION | Freq: Once | RESPIRATORY_TRACT | Status: AC
  Administered 2020-08-29: 2.5 mg via RESPIRATORY_TRACT
  Filled 2020-08-29: qty 3

## 2020-09-22 ENCOUNTER — Telehealth: Payer: Self-pay | Admitting: Pulmonary Disease

## 2020-09-22 ENCOUNTER — Ambulatory Visit: Payer: 59 | Admitting: Pulmonary Disease

## 2020-09-22 ENCOUNTER — Encounter: Payer: Self-pay | Admitting: Pulmonary Disease

## 2020-09-22 ENCOUNTER — Other Ambulatory Visit: Payer: Self-pay

## 2020-09-22 VITALS — BP 152/88 | HR 98 | Temp 97.8°F | Ht 66.0 in | Wt 290.4 lb

## 2020-09-22 DIAGNOSIS — J683 Other acute and subacute respiratory conditions due to chemicals, gases, fumes and vapors: Secondary | ICD-10-CM

## 2020-09-22 DIAGNOSIS — Z6841 Body Mass Index (BMI) 40.0 and over, adult: Secondary | ICD-10-CM

## 2020-09-22 DIAGNOSIS — E8770 Fluid overload, unspecified: Secondary | ICD-10-CM | POA: Diagnosis not present

## 2020-09-22 DIAGNOSIS — R0602 Shortness of breath: Secondary | ICD-10-CM

## 2020-09-22 DIAGNOSIS — R0982 Postnasal drip: Secondary | ICD-10-CM | POA: Diagnosis not present

## 2020-09-22 MED ORDER — TORSEMIDE 20 MG PO TABS: 20.0000 mg | ORAL_TABLET | Freq: Every day | ORAL | 1 refills | Status: DC

## 2020-09-22 MED ORDER — PULMICORT FLEXHALER 180 MCG/ACT IN AEPB: 1.0000 | INHALATION_SPRAY | Freq: Two times a day (BID) | RESPIRATORY_TRACT | 11 refills | Status: DC

## 2020-09-22 MED ORDER — QVAR REDIHALER 80 MCG/ACT IN AERB
2.0000 | INHALATION_SPRAY | Freq: Two times a day (BID) | RESPIRATORY_TRACT | 3 refills | Status: DC
Start: 2020-09-22 — End: 2020-09-22

## 2020-09-22 NOTE — Telephone Encounter (Signed)
Rx for Pulmicort has been sent to preferred pharmacy. Patient is aware and voiced her understanding.  Nothing further needed at this time.

## 2020-09-22 NOTE — Telephone Encounter (Signed)
Received denial for Qvar redihaler. Preferred medications are Arnuity, Flovent HFA and Diskus and Pulmicort Flexhaler.  Dr. Patsey Berthold, please advise. Thanks

## 2020-09-22 NOTE — Progress Notes (Signed)
Subjective:    Patient ID: [name], female    DOB: [birth year], 65 y.o.   MRN: [id number]  HPI Patient is a 65 year old lifelong never smoker who follows here on the issue of dyspnea.  She was initially evaluated here on 07 August 2020.  Please refer to that note for details.  At the time also complained of a nonproductive cough.  She does have issues with phonic sinusitis and postnasal drip.  In addition she has been having issues with obesity.  BMI today noted at 46.8.  As noted during her initial visit she was also having significant lower extremity edema and noted that furosemide did not help her.  We gave her a trial of Demadex and this has helped her tremendously.  She had been given a trial of Asmanex by her primary care provider and had noted some improvement on the cough.  She was started on azelastine for her nasal issues.  She has noted marked improvement on this.  Overall the patient states that her dyspnea is markedly improved particularly since starting Demadex.  DATA: 07/14/2020 chest x-ray PA and lateral: Mild interstitial prominence hazy bilateral opacities (following COVID) 07/31/2020 chest x-ray PA and lateral: Mild chronic changes.  No acute process 08/22/2020 2D echo: LVEF 60 to 65%, DD grade I, left atrium moderately dilated mild dilatation of the aortic root, difficult imaging due to body habitus 08/29/2020 PFTs: 51 1.93 L or 73% predicted, FVC 2.31 L or 67% predicted, FEV1/FVC 84%, lung volumes mildly reduced.  ERV 20%.  There is a small airways component.  Normal diffusion capacity.  This is consistent with mild restriction with small airways component likely on the basis of the CT with mild reactive airways disease.  Review of Systems A 10 point review of systems was performed and it is as noted above otherwise negative.  Patient Active Problem List   Diagnosis Date Noted   Absolute anemia 02/06/2015   Accumulation of fluid in tissues 02/06/2015   Abnormal  liver enzymes 02/06/2015   Angioma 02/06/2015   Hydronephrosis 02/06/2015   Calcium blood increased 02/06/2015   Hepatic cyst 02/06/2015   Headache, migraine 02/06/2015   Avitaminosis D 02/06/2015   Avitaminosis 02/06/2015   Hyponatremia 01/29/2015   No Known Allergies Current Meds  Medication Sig   Azelastine HCl 0.15 % SOLN Place 1 spray into the nose in the morning and at bedtime.   [DISCONTINUED] beclomethasone (QVAR REDIHALER) 80 MCG/ACT inhaler Inhale 2 puffs into the lungs 2 (two) times daily.   [DISCONTINUED] Mometasone Furoate (ASMANEX HFA) 100 MCG/ACT AERO Inhale 2 Act into the lungs 2 (two) times daily.   Immunization History  Administered Date(s) Administered   Influenza,inj,Quad PF,6+ Mos 05/12/2018   Influenza-Unspecified 05/15/2020   Moderna SARS-COV2 Booster Vaccination 07/14/2020   Moderna Sars-Covid-2 Vaccination 10/13/2019, 11/10/2019   Tdap 04/10/2009, 01/31/2020       Objective:   Physical Exam BP (!) 152/88 (BP Location: Left Arm, Cuff Size: Large)    Pulse 98    Temp 97.8 F (36.6 C) (Temporal)    Ht 5\' 6"  (1.676 m)    Wt 290 lb 6.4 oz (131.7 kg)    SpO2 98%    BMI 46.87 kg/m  GENERAL: Morbidly obese woman, no acute distress no conversational dyspnea, fully ambulatory. HEAD: Normocephalic, atraumatic.  EYES: Pupils equal, round, reactive to light.  No scleral icterus.  MOUTH: Nose/mouth/throat not examined due to masking requirements for COVID 19. NECK: Supple. No thyromegaly. Trachea midline.  No JVD.  No adenopathy. PULMONARY: Good air entry bilaterally.  No adventitious sounds. CARDIOVASCULAR: S1 and S2. Regular rate and rhythm.  Grade 2/6 systolic ejection murmur over the aortic area. ABDOMEN: Obese, benign. MUSCULOSKELETAL: No joint deformity, no clubbing, there is 2+ to 3+ edema of the lower extremities to the level of mid shin.  No calf tenderness. NEUROLOGIC: No overt focal deficit, speech is fluent, no gait disturbance. SKIN:  Intact,warm,dry.  Mental exam no rashes. PSYCH: Mood and behavior normal      Assessment & Plan:     ICD-10-CM   1. Shortness of breath  R06.02    Improved, multifactorial Obesity with restrictive physiology secondary to the same Diastolic dysfunction with associated edema   2. Reactive airways dysfunction syndrome (HCC)  J68.3    Continue inhaled corticosteroid Switch to Pulmicort 180, 1 puff twice a day Rinse mouth after use  3. Post-nasal discharge  R09.82    Continue azelastine and nasal hygiene Cough has responded to management of this  4. Edema due to hypervolemia  E87.70    Improved with use of Demadex Recommend good blood pressure control Patient encouraged to discuss with primary care provider  5. Morbid obesity with BMI of 45.0-49.9, adult ([place])  E66.01    Z68.42    Weight loss is recommended    Discussion: Patient's dyspnea is likely related to restrictive physiology from obesity.  She has very mild airways reactivity but doubt that this is playing a major component.  On x-ray it shows that she has "interstitial prominence" however I suspect that this is mostly related to films being shot through the dense breast tissue.  Her diffusion capacity is normal and doubt that she has significant if any interstitial lung disease.  She has like dysfunction with left atrial enlargement that indicates that she has issues with volume overload and perhaps more pronounced diastolic dysfunction than noted.  She needs to have attention to good blood pressure control (blood pressure today 152/88).  She requested a refill on Demadex which was given to her however I told her that she will need to get further refills on this medication from primary care provider if it continues to be indicated.  So far her edema has responded well to this.  She has noted that she has started a ketogenic diet.  3 to 4 months time she is to contact us prior to that time should any new difficulties arise.  Renold Don, MD [facility]   *This note was dictated using voice recognition software/Dragon.  Despite best efforts to proofread, errors can occur which can change the meaning.  Any change was purely unintentional.

## 2020-09-22 NOTE — Telephone Encounter (Signed)
Pulmicort 180 mcg 1 puff twice a day.  She may need instruction on the use of the inhaler as it is different than Qvar.  YouTube and how to use it.  However we will be happy to show her if she needs.

## 2020-09-22 NOTE — Patient Instructions (Signed)
We are switching your Asmanex to Qvar 2 puffs twice a day.  Make sure you rinse your mouth well after you use it.  Continue efforts to lose weight.  I have renewed your fluid pill but these must be renewed by her primary care practitioner.  We will see him in follow-up in 3 to 4 months time.  Call sooner should any new problems arise.

## 2020-09-29 ENCOUNTER — Telehealth: Payer: Self-pay | Admitting: Pulmonary Disease

## 2020-09-29 MED ORDER — AZELASTINE HCL 0.15 % NA SOLN: 1.0000 | Freq: Two times a day (BID) | NASAL | 2 refills | Status: AC

## 2020-09-29 MED ORDER — PULMICORT FLEXHALER 180 MCG/ACT IN AEPB: 1.0000 | INHALATION_SPRAY | Freq: Two times a day (BID) | RESPIRATORY_TRACT | 11 refills | Status: DC

## 2020-09-29 NOTE — Telephone Encounter (Signed)
Rx for Pulmicort and Azelastine nasal spray has been sent to preferred pharmacy.  Patient is aware and voiced her understanding.  Nothing further needed at this time.

## 2020-12-15 ENCOUNTER — Other Ambulatory Visit: Payer: Self-pay | Admitting: Family Medicine

## 2020-12-15 ENCOUNTER — Telehealth: Payer: Self-pay

## 2020-12-15 DIAGNOSIS — R609 Edema, unspecified: Secondary | ICD-10-CM

## 2020-12-15 MED ORDER — TORSEMIDE 20 MG PO TABS: 20.0000 mg | ORAL_TABLET | Freq: Every day | ORAL | 1 refills | Status: DC

## 2020-12-15 NOTE — Telephone Encounter (Signed)
Copied from [id number] [phone]. Topic: General - Other >> Dec 15, 2020  1:13 PM Tessa Lerner A wrote: Reason for CRM: Patient would like to be contacted regarding their prescription for torsemide (DEMADEX) 20 MG tablet   The medication was initially prescribed by the patient's pulmonologist but they would like to know if it is possible to coordinate refills through the practice  Please contact to further advise when possible

## 2020-12-15 NOTE — Telephone Encounter (Signed)
Sent refill, but, need follow up appointment with fasting labs in the next 30 days.

## 2021-02-12 ENCOUNTER — Encounter: Payer: Self-pay | Admitting: Emergency Medicine

## 2021-02-12 ENCOUNTER — Emergency Department
Admission: EM | Admit: 2021-02-12 | Discharge: 2021-02-12 | Disposition: A | Discharge status: Home / Self Care | Payer: 59 | Attending: Emergency Medicine | Admitting: Emergency Medicine

## 2021-02-12 ENCOUNTER — Emergency Department: Payer: 59

## 2021-02-12 ENCOUNTER — Other Ambulatory Visit: Payer: Self-pay

## 2021-02-12 DIAGNOSIS — R079 Chest pain, unspecified: Secondary | ICD-10-CM

## 2021-02-12 DIAGNOSIS — M25512 Pain in left shoulder: Secondary | ICD-10-CM | POA: Diagnosis not present

## 2021-02-12 DIAGNOSIS — R0789 Other chest pain: Secondary | ICD-10-CM | POA: Diagnosis not present

## 2021-02-12 DIAGNOSIS — Z79899 Other long term (current) drug therapy: Secondary | ICD-10-CM | POA: Insufficient documentation

## 2021-02-12 DIAGNOSIS — I10 Essential (primary) hypertension: Secondary | ICD-10-CM | POA: Diagnosis not present

## 2021-02-12 LAB — BASIC METABOLIC PANEL
Anion gap: 11 (ref 5–15)
BUN: 18 mg/dL (ref 8–23)
CO2: 23 mmol/L (ref 22–32)
Calcium: 10.1 mg/dL (ref 8.9–10.3)
Chloride: 106 mmol/L (ref 98–111)
Creatinine, Ser: 0.61 mg/dL (ref 0.44–1.00)
GFR, Estimated: >60 mL/min (ref >60)
Glucose, Bld: 106 mg/dL — ABNORMAL HIGH (ref 70–99)
Potassium: 4.4 mmol/L (ref 3.5–5.1)
Sodium: 140 mmol/L (ref 135–145)

## 2021-02-12 LAB — TROPONIN I (HIGH SENSITIVITY)
Troponin I (High Sensitivity): 4 ng/L (ref <18)
Troponin I (High Sensitivity): 5 ng/L (ref <18)

## 2021-02-12 LAB — D-DIMER, QUANTITATIVE: D-Dimer, Quant: 0.27 ug/mL-FEU (ref 0.00–0.50)

## 2021-02-12 LAB — CBC
HCT: 42.1 % (ref 36.0–46.0)
Hemoglobin: 13.6 g/dL (ref 12.0–15.0)
MCH: 27.4 pg (ref 26.0–34.0)
MCHC: 32.3 g/dL (ref 30.0–36.0)
MCV: 84.9 fL (ref 80.0–100.0)
Platelets: 250 10*3/uL (ref 150–400)
RBC: 4.96 MIL/uL (ref 3.87–5.11)
RDW: 14.9 % (ref 11.5–15.5)
WBC: 6.5 10*3/uL (ref 4.0–10.5)
nRBC: 0 % (ref 0.0–0.2)

## 2021-02-12 NOTE — Discharge Instructions (Addendum)
Your tests today were all normal. Please follow up with your doctor within 1 week to reassess your blood pressure and your symptoms.

## 2021-02-12 NOTE — ED Provider Notes (Signed)
[facility] Emergency Department Provider Note  ____________________________________________  Time seen: Approximately 10:59 AM  I have reviewed the triage vital signs and the nursing notes.   HISTORY  Chief Complaint Chest Pain    HPI [name] is a 65 y.o. female with a history of hypertension, migraines, morbid obesity who comes the ED complaining of central chest tightness that started last night while sitting in a recliner.  Constant, waxing and waning, no shortness of breath diaphoresis or vomiting, nonradiating.  Not exertional.  Somewhat worsened by deep breathing and by movement.  Also notes over the last 3 days she has had left scapula pain that started a few hours after she did a lot of cleaning at church.  That pain is worse with movement and change in position.  She was not having any shortness of breath or pleuritic pain with that.  No fever or cough.    Past Medical History:  Diagnosis Date   Chronic sinusitis    Hypertension    Migraines    Morbid obesity ([place])    Sensorineural hearing loss      Patient Active Problem List   Diagnosis Date Noted   Absolute anemia 02/06/2015   Accumulation of fluid in tissues 02/06/2015   Abnormal liver enzymes 02/06/2015   Angioma 02/06/2015   Hydronephrosis 02/06/2015   Calcium blood increased 02/06/2015   Hepatic cyst 02/06/2015   Headache, migraine 02/06/2015   Avitaminosis D 02/06/2015   Avitaminosis 02/06/2015   Hyponatremia 01/29/2015     Past Surgical History:  Procedure Laterality Date   ABDOMINAL HYSTERECTOMY  2008   Still has cervix   APPENDECTOMY  1967   BACK SURGERY     Ruptured disc.   BACK SURGERY  1985   Disc placed in back   [place]     Prior to Admission medications   Medication Sig Start Date End Date Taking? Authorizing Provider  Azelastine HCl 0.15 % SOLN Place 1 spray into the nose in the morning and at bedtime. 09/29/20   Tyler Pita, MD  budesonide (PULMICORT FLEXHALER) 180 MCG/ACT inhaler Inhale 1 puff into the lungs in the morning and at bedtime. 09/29/20   Tyler Pita, MD  torsemide (DEMADEX) 20 MG tablet Take 1 tablet (20 mg total) by mouth daily. 12/15/20 01/14/21  Chrismon, Vickki Muff, PA-C     Allergies Patient has no known allergies.   Family History  Problem Relation Age of Onset   Bipolar disorder Mother    Emphysema Mother    Depression Mother    Ulcers Mother    Heart attack Father    Hypertension Sister    Edema Sister    Colon cancer Maternal Grandmother    Heart attack Paternal Grandfather    Hypertension Brother    Arthritis Brother     Social History Social History   Tobacco Use   Smoking status: Never   Smokeless tobacco: Never  Substance Use Topics   Alcohol use: No   Drug use: No    Review of Systems  Constitutional:   No fever or chills.  ENT:   No sore throat. No rhinorrhea. Cardiovascular:   Positive chest tightness as above without syncope. Respiratory:   No dyspnea or cough. Gastrointestinal:   Negative for abdominal pain, vomiting and diarrhea.  Musculoskeletal:   Positive left scapular pain All other systems reviewed and are negative except as documented above in ROS and HPI.  ____________________________________________  PHYSICAL EXAM:  VITAL SIGNS: ED Triage Vitals  Enc Vitals Group     BP 02/12/21 0853 (!) 206/103     Pulse Rate 02/12/21 0853 88     Resp 02/12/21 0853 20     Temp 02/12/21 0853 98.1 F (36.7 C)     Temp Source 02/12/21 0853 Oral     SpO2 02/12/21 0853 97 %     Weight 02/12/21 0854 290 lb 5.5 oz (131.7 kg)     Height 02/12/21 0854 5\' 6"  (1.676 m)     Head Circumference --      Peak Flow --      Pain Score 02/12/21 0854 6     Pain Loc --      Pain Edu? --      Excl. in [place]? --     Vital signs reviewed, nursing assessments reviewed.   Constitutional:   Alert and oriented. Non-toxic appearance. Eyes:   Conjunctivae are normal.  EOMI. PERRL. ENT      Head:   Normocephalic and atraumatic.      Nose:   Normal      Mouth/Throat:   Normal, moist mucosa      Neck:   No meningismus. Full ROM. Hematological/Lymphatic/Immunilogical:   No cervical lymphadenopathy. Cardiovascular:   RRR. Symmetric bilateral radial and DP pulses.  No murmurs. Cap refill less than 2 seconds. Respiratory:   Normal respiratory effort without tachypnea/retractions. Breath sounds are clear and equal bilaterally. No wheezes/rales/rhonchi.  No inducible wheezing or cough with FEV1 maneuver Gastrointestinal:   Soft and nontender. Non distended. There is no CVA tenderness.  No rebound, rigidity, or guarding. Genitourinary:   deferred Musculoskeletal:   Normal range of motion in all extremities. No joint effusions.  No lower extremity tenderness.  No edema.  There is tenderness medial to the left scapula along the rhomboid muscle group reproducing her scapular pain. Neurologic:   Normal speech and language.  Motor grossly intact. No acute focal neurologic deficits are appreciated.  Skin:    Skin is warm, dry and intact. No rash noted.  No petechiae, purpura, or bullae.  ____________________________________________    LABS (pertinent positives/negatives) (all labs ordered are listed, but only abnormal results are displayed) Labs Reviewed  BASIC METABOLIC PANEL - Abnormal; Notable for the following components:      Result Value   Glucose, Bld 106 (*)    All other components within normal limits  CBC  D-DIMER, QUANTITATIVE  TROPONIN I (HIGH SENSITIVITY)  TROPONIN I (HIGH SENSITIVITY)   ____________________________________________   EKG  Interpreted by me  Date: 02/12/2021  Rate: 85  Rhythm: normal sinus rhythm  QRS Axis: normal  Intervals: normal  ST/T Wave abnormalities: normal  Conduction Disutrbances: none  Narrative Interpretation: unremarkable     ____________________________________________    RADIOLOGY  DG Chest 2  View  Result Date: 02/12/2021 CLINICAL DATA:  Chest pain.  Left scapular pain since Monday EXAM: CHEST - 2 VIEW COMPARISON:  07/31/2020 FINDINGS: Normal heart size and mediastinal contours. No acute infiltrate or edema. No effusion or pneumothorax. No acute osseous findings. IMPRESSION: No evidence of acute disease. Electronically Signed   By: Monte Fantasia M.D.   On: 02/12/2021 09:43    ____________________________________________   PROCEDURES Procedures  ____________________________________________  DIFFERENTIAL DIAGNOSIS   Non-STEMI, pulmonary embolism, chest wall strain, GERD, pneumothorax  CLINICAL IMPRESSION / ASSESSMENT AND PLAN / ED COURSE  Medications ordered in the ED: Medications - No data to display  Pertinent labs &  imaging results that were available during my care of the patient were reviewed by me and considered in my medical decision making (see chart for details).  [name] was evaluated in Emergency Department on 02/12/2021 for the symptoms described in the history of present illness. She was evaluated in the context of the global COVID-19 pandemic, which necessitated consideration that the patient might be at risk for infection with the SARS-CoV-2 virus that causes COVID-19. Institutional protocols and algorithms that pertain to the evaluation of patients at risk for COVID-19 are in a state of rapid change based on information released by regulatory bodies including the CDC and federal and state organizations. These policies and algorithms were followed during the patient's care in the ED.   Patient presents with atypical chest pain. Considering the patient's symptoms, medical history, and physical examination today, I have low suspicion for ACS, Salinas, TAD, pneumothorax, carditis, mediastinitis, pneumonia, CHF, or sepsis. Due to somewhat pleuritic nature, will obtain D-dimer to further risk stratify for Salinas, although with normal vital signs I low suspicion.  We  will also trend troponin.  Most likely this is musculoskeletal and patient can be managed with supportive care at home.   ----------------------------------------- 12:21 PM on 02/12/2021 ----------------------------------------- Labs including D-dimer and serial troponin are all normal.  Symptoms are atypical for ACS or other acute cardiopulmonary event.  Stable for discharge to follow-up with primary care.     ____________________________________________   FINAL CLINICAL IMPRESSION(S) / ED DIAGNOSES    Final diagnoses:  Nonspecific chest pain     ED Discharge Orders     None       Portions of this note were generated with dragon dictation software. Dictation errors may occur despite best attempts at proofreading.    Carrie Mew, MD 02/12/21 1221

## 2021-02-12 NOTE — ED Triage Notes (Signed)
C/O left scapular pain since Monday night.  States last night, left chest also feeling tight.   4 baby ASA taken today PTA at 0730  AAOx3.  Skin warm and dry. NAD

## 2021-02-12 NOTE — ED Notes (Signed)
Pt discharged at this time. No chest pain reported upon discharge. Pt verbalized understanding of discharge instructions. Ambulatory at discharge.

## 2021-02-16 ENCOUNTER — Other Ambulatory Visit: Payer: Self-pay

## 2021-02-16 ENCOUNTER — Encounter: Payer: Self-pay | Admitting: Family Medicine

## 2021-02-16 ENCOUNTER — Ambulatory Visit: Payer: 59 | Admitting: Family Medicine

## 2021-02-16 VITALS — BP 151/86 | HR 90 | Temp 98.1°F | Ht 66.0 in | Wt 278.8 lb

## 2021-02-16 DIAGNOSIS — B029 Zoster without complications: Secondary | ICD-10-CM | POA: Diagnosis not present

## 2021-02-16 DIAGNOSIS — I10 Essential (primary) hypertension: Secondary | ICD-10-CM | POA: Diagnosis not present

## 2021-02-16 MED ORDER — VALACYCLOVIR HCL 1 G PO TABS: 1000.0000 mg | ORAL_TABLET | Freq: Two times a day (BID) | ORAL | 0 refills | Status: DC

## 2021-02-16 MED ORDER — LISINOPRIL 2.5 MG PO TABS: 2.5000 mg | ORAL_TABLET | Freq: Every day | ORAL | 3 refills | Status: DC

## 2021-02-16 NOTE — Progress Notes (Signed)
Established patient visit   Patient: [name]   DOB: 15-Aug-1955   65 y.o. Female  MRN: [id number] Visit Date: 02/16/2021  Today's healthcare provider: Vernie Murders, PA-C   No chief complaint on file.  Subjective    HPI   Pt last seen at ED on 02/12/21 for chest pains Follow up Hospitalization  Patient was admitted to [facility] on 02/12/21 and discharged on 02/12/21. She was treated for chest pain/tightness. Treatment for this included follow up with PCP in one week to reevaluate BP. She reports good compliance with treatment. She reports this condition is improved.  Stabbing pain that comes and goes any where from 5 minutes to 15 minutes. Not sure if it is shingles. Never received the vaccine.Monitors BP 2xs daily since leaving ED. Feels pain when inhaling or moving left arm.  ----------------------------------------------------------------------------------------- ---------------------------------------------------------------------------------------------------   Patient Active Problem List   Diagnosis Date Noted   Absolute anemia 02/06/2015   Accumulation of fluid in tissues 02/06/2015   Abnormal liver enzymes 02/06/2015   Angioma 02/06/2015   Hydronephrosis 02/06/2015   Calcium blood increased 02/06/2015   Hepatic cyst 02/06/2015   Headache, migraine 02/06/2015   Avitaminosis D 02/06/2015   Avitaminosis 02/06/2015   Hyponatremia 01/29/2015   Past Medical History:  Diagnosis Date   Chronic sinusitis    Hypertension    Migraines    Morbid obesity ([place])    Sensorineural hearing loss    No Known Allergies     Medications: Outpatient Medications Prior to Visit  Medication Sig   Azelastine HCl 0.15 % SOLN Place 1 spray into the nose in the morning and at bedtime.   budesonide (PULMICORT FLEXHALER) 180 MCG/ACT inhaler Inhale 1 puff into the lungs in the morning and at bedtime.   torsemide (DEMADEX) 20 MG tablet Take 1 tablet (20 mg total) by mouth daily.    No facility-administered medications prior to visit.    Review of Systems  Constitutional: Negative.   HENT: Negative.    Respiratory: Negative.    Cardiovascular: Negative.   Skin:  Positive for rash.       Painful rash.       Objective    BP (!) 151/86   Pulse 90   Temp 98.1 F (36.7 C) (Oral)   Ht '5\' 6"'$  (1.676 m)   Wt 278 lb 12.8 oz (126.5 kg)   SpO2 98%   BMI 45.00 kg/m   Physical Exam Constitutional:      General: She is not in acute distress.    Appearance: She is well-developed.  HENT:     Head: Normocephalic and atraumatic.     Right Ear: Hearing normal.     Left Ear: Hearing normal.     Nose: Nose normal.  Eyes:     General: Lids are normal. No scleral icterus.       Right eye: No discharge.        Left eye: No discharge.     Conjunctiva/sclera: Conjunctivae normal.  Pulmonary:     Effort: Pulmonary effort is normal. No respiratory distress.  Musculoskeletal:        General: Normal range of motion.  Skin:    Findings: Rash present. No lesion.     Comments: Painful rash on the left upper breast radiating to the axilla and scapula.  Neurological:     Mental Status: She is alert and oriented to person, place, and time.  Psychiatric:  Speech: Speech normal.        Behavior: Behavior normal.        Thought Content: Thought content normal.      No results found for any visits on 02/16/21.  Assessment & Plan     1. Herpes zoster without complication Developed pain in the left upper chest radiating to the scapula over the past 4 days. Evaluation in the ER on 02-12-21 was negative for cardiovascular disease. Rash became apparent and tender over the past 3 days. Will treat with Valtrex and recheck prn. - valACYclovir (VALTREX) 1000 MG tablet; Take 1 tablet (1,000 mg total) by mouth 2 (two) times daily.  Dispense: 20 tablet; Refill: 0  2. Essential hypertension Had high BP in the ER and 159/99 today at home. Better now. Will give low dose of  Lisinopril since she has had problems with BP in the past and she will continue to monitor readings at home. Call report of response in a week. - lisinopril (ZESTRIL) 2.5 MG tablet; Take 1 tablet (2.5 mg total) by mouth daily.  Dispense: 30 tablet; Refill: 3   No follow-ups on file.      I, Cobi Aldape, PA-C, have reviewed all documentation for this visit. The documentation on 02/16/21 for the exam, diagnosis, procedures, and orders are all accurate and complete.    Vernie Murders, PA-C  [employer] [phone] (phone) [phone] (fax)  [place]

## 2021-02-26 ENCOUNTER — Inpatient Hospital Stay: Payer: 59 | Admitting: Family Medicine

## 2021-02-27 ENCOUNTER — Other Ambulatory Visit: Payer: Self-pay | Admitting: Family Medicine

## 2021-02-27 DIAGNOSIS — Z1231 Encounter for screening mammogram for malignant neoplasm of breast: Secondary | ICD-10-CM

## 2021-03-19 ENCOUNTER — Other Ambulatory Visit: Payer: Self-pay

## 2021-03-19 ENCOUNTER — Ambulatory Visit
Admission: RE | Admit: 2021-03-19 | Discharge: 2021-03-19 | Disposition: A | Discharge status: Home / Self Care | Payer: 59 | Source: Ambulatory Visit | Attending: Family Medicine | Admitting: Family Medicine

## 2021-03-19 DIAGNOSIS — Z1231 Encounter for screening mammogram for malignant neoplasm of breast: Secondary | ICD-10-CM | POA: Diagnosis not present

## 2021-03-20 ENCOUNTER — Other Ambulatory Visit: Payer: Self-pay | Admitting: Family Medicine

## 2021-03-20 DIAGNOSIS — R609 Edema, unspecified: Secondary | ICD-10-CM

## 2021-03-24 ENCOUNTER — Ambulatory Visit (INDEPENDENT_AMBULATORY_CARE_PROVIDER_SITE_OTHER): Payer: 59 | Admitting: Family Medicine

## 2021-03-24 ENCOUNTER — Encounter: Payer: Self-pay | Admitting: Family Medicine

## 2021-03-24 ENCOUNTER — Other Ambulatory Visit: Payer: Self-pay

## 2021-03-24 VITALS — BP 162/82 | HR 72 | Temp 98.1°F | Resp 18 | Ht 66.0 in | Wt 287.6 lb

## 2021-03-24 DIAGNOSIS — Z9889 Other specified postprocedural states: Secondary | ICD-10-CM | POA: Diagnosis not present

## 2021-03-24 DIAGNOSIS — I10 Essential (primary) hypertension: Secondary | ICD-10-CM | POA: Diagnosis not present

## 2021-03-24 DIAGNOSIS — R609 Edema, unspecified: Secondary | ICD-10-CM

## 2021-03-24 DIAGNOSIS — Z1159 Encounter for screening for other viral diseases: Secondary | ICD-10-CM

## 2021-03-24 DIAGNOSIS — Z114 Encounter for screening for human immunodeficiency virus [HIV]: Secondary | ICD-10-CM

## 2021-03-24 DIAGNOSIS — Z13220 Encounter for screening for lipoid disorders: Secondary | ICD-10-CM

## 2021-03-24 DIAGNOSIS — Z0000 Encounter for general adult medical examination without abnormal findings: Secondary | ICD-10-CM | POA: Diagnosis not present

## 2021-03-24 NOTE — Progress Notes (Signed)
Complete physical exam   Patient: [name]   DOB: 08/15/1955   65 y.o. Female  MRN: [id number] Visit Date: 03/24/2021  Today's healthcare provider: Vernie Murders, PA-C   Chief Complaint  Patient presents with   Annual Exam   Subjective    [name] Pen is a 65 y.o. female who presents today for a complete physical exam.  She reports consuming a general diet. The patient does not participate in regular exercise at present. She generally feels fairly well. She reports sleeping fairly well. She does not have additional problems to discuss today.   Past Medical History:  Diagnosis Date   Chronic sinusitis    Hypertension    Migraines    Morbid obesity ([place])    Sensorineural hearing loss    Past Surgical History:  Procedure Laterality Date   ABDOMINAL HYSTERECTOMY  07-Nov-2006   Still has cervix   APPENDECTOMY  1967   BACK SURGERY     Ruptured disc.   BACK SURGERY  1985   Disc placed in back   TONSILLECTOMY  1960   Social History   Socioeconomic History   Marital status: Widowed    Spouse name: Not on file   Number of children: 4   Years of education: H/S   Highest education level: Not on file  Occupational History    Comment: Full-time 50-60 hours weekly  Tobacco Use   Smoking status: Never   Smokeless tobacco: Never  Substance and Sexual Activity   Alcohol use: No   Drug use: No   Sexual activity: Not on file  Other Topics Concern   Not on file  Social History Narrative   Not on file   Social Determinants of Health   Financial Resource Strain: Not on file  Food Insecurity: Not on file  Transportation Needs: Not on file  Physical Activity: Not on file  Stress: Not on file  Social Connections: Not on file  Intimate Partner Violence: Not on file   Family Status  Relation Name Status   Mother  Deceased   Father  Deceased at age 37       from a heart attack   Sister  Alive   MGM  Deceased at age 61       Died from Colon Cancer   PGF   Deceased at age 48's       Heart Attack   Brother  Deceased       Died from [place] in 07-Nov-1971   Brother  Alive   Neg Hx  (Not Specified)   Family History  Problem Relation Age of Onset   Bipolar disorder Mother    Emphysema Mother    Depression Mother    Ulcers Mother    Heart attack Father    Hypertension Sister    Edema Sister    Colon cancer Maternal Grandmother    Heart attack Paternal Grandfather    Hypertension Brother    Arthritis Brother    Breast cancer Neg Hx    No Known Allergies  Patient Care Team: Kamsiyochukwu Buist, Vickki Muff, PA-C as PCP - General (Family Medicine)   Medications: Outpatient Medications Prior to Visit  Medication Sig   Azelastine HCl 0.15 % SOLN Place 1 spray into the nose in the morning and at bedtime.   budesonide (PULMICORT FLEXHALER) 180 MCG/ACT inhaler Inhale 1 puff into the lungs in the morning and at bedtime.   lisinopril (ZESTRIL) 2.5 MG tablet Take 1 tablet (  2.5 mg total) by mouth daily.   torsemide (DEMADEX) 20 MG tablet TAKE 1 TABLET(20 MG) BY MOUTH DAILY   valACYclovir (VALTREX) 1000 MG tablet Take 1 tablet (1,000 mg total) by mouth 2 (two) times daily.   No facility-administered medications prior to visit.    Review of Systems  Constitutional:  Negative for chills, fatigue and fever.  HENT:  Negative for congestion, ear pain, rhinorrhea, sneezing and sore throat.   Eyes: Negative.  Negative for pain and redness.  Respiratory:  Negative for cough, shortness of breath and wheezing.   Cardiovascular:  Positive for leg swelling. Negative for chest pain.  Gastrointestinal:  Negative for abdominal pain, blood in stool, constipation, diarrhea and nausea.  Endocrine: Negative for polydipsia and polyphagia.  Genitourinary: Negative.  Negative for dysuria, flank pain, hematuria, pelvic pain, vaginal bleeding and vaginal discharge.  Musculoskeletal:  Negative for arthralgias, back pain, gait problem and joint swelling.  Skin:  Negative for rash.   Neurological: Negative.  Negative for dizziness, tremors, seizures, weakness, light-headedness, numbness and headaches.  Hematological:  Negative for adenopathy.  Psychiatric/Behavioral: Negative.  Negative for behavioral problems, confusion and dysphoric mood. The patient is not nervous/anxious and is not hyperactive.      Objective    BP (!) 162/82 (BP Location: Right Arm, Patient Position: Sitting, Cuff Size: Large)   Pulse 72   Temp 98.1 F (36.7 C) (Temporal)   Resp 18   Ht '5\' 6"'$  (1.676 m)   Wt 287 lb 9.6 oz (130.5 kg)   SpO2 97% Comment: room air  BMI 46.42 kg/m  BP Readings from Last 3 Encounters:  03/24/21 (!) 162/82  02/16/21 (!) 151/86  02/12/21 (!) 166/95   Wt Readings from Last 3 Encounters:  03/24/21 287 lb 9.6 oz (130.5 kg)  02/16/21 278 lb 12.8 oz (126.5 kg)  02/12/21 290 lb 5.5 oz (131.7 kg)   Physical Exam Constitutional:      Appearance: She is well-developed.  HENT:     Head: Normocephalic and atraumatic.     Right Ear: External ear normal.     Left Ear: External ear normal.     Nose: Nose normal.  Eyes:     General:        Right eye: No discharge.     Conjunctiva/sclera: Conjunctivae normal.     Pupils: Pupils are equal, round, and reactive to light.  Neck:     Thyroid: No thyromegaly.     Trachea: No tracheal deviation.  Cardiovascular:     Rate and Rhythm: Normal rate and regular rhythm.     Heart sounds: Normal heart sounds. No murmur heard. Pulmonary:     Effort: Pulmonary effort is normal. No respiratory distress.     Breath sounds: Normal breath sounds. No wheezing or rales.  Chest:     Chest wall: No tenderness.  Abdominal:     General: There is no distension.     Palpations: Abdomen is soft. There is no mass.     Tenderness: There is no abdominal tenderness. There is no guarding or rebound.  Musculoskeletal:        General: No tenderness. Normal range of motion.     Cervical back: Normal range of motion and neck supple.   Lymphadenopathy:     Cervical: No cervical adenopathy.  Skin:    General: Skin is warm and dry.     Findings: No erythema or rash.  Neurological:     Mental Status: She is alert and  oriented to person, place, and time.     Cranial Nerves: No cranial nerve deficit.     Motor: No abnormal muscle tone.     Coordination: Coordination normal.     Deep Tendon Reflexes: Reflexes are normal and symmetric. Reflexes normal.  Psychiatric:        Behavior: Behavior normal.        Thought Content: Thought content normal.        Judgment: Judgment normal.      Last depression screening scores PHQ 2/9 Scores 02/16/2021 01/31/2020 12/14/2019  PHQ - 2 Score 0 0 0  PHQ- 9 Score 0 1 -   Last fall risk screening Fall Risk  02/16/2021  Falls in the past year? 0  Number falls in past yr: 0  Injury with Fall? 0  Risk for fall due to : No Fall Risks  Follow up -   Last Audit-C alcohol use screening Alcohol Use Disorder Test (AUDIT) 02/16/2021  1. How often do you have a drink containing alcohol? 0  2. How many drinks containing alcohol do you have on a typical day when you are drinking? 0  3. How often do you have six or more drinks on one occasion? 0  AUDIT-C Score 0   A score of 3 or more in women, and 4 or more in men indicates increased risk for alcohol abuse, EXCEPT if all of the points are from question 1   No results found for any visits on 03/24/21.  Assessment & Plan    Routine Health Maintenance and Physical Exam  Exercise Activities and Dietary recommendations  Goals   Continue low fat diet and walking for exercise as knees allow.     Immunization History  Administered Date(s) Administered   Influenza,inj,Quad PF,6+ Mos 05/12/2018   Influenza-Unspecified 05/15/2020   Moderna SARS-COV2 Booster Vaccination 07/14/2020   Moderna Sars-Covid-2 Vaccination 10/13/2019, 11/10/2019   Tdap 04/10/2009, 01/31/2020    Health Maintenance  Topic Date Due   HIV Screening  Never done    Hepatitis C Screening  Never done   PAP SMEAR-Modifier  Never done   Zoster Vaccines- Shingrix (1 of 2) Never done   COVID-19 Vaccine (4 - Booster for Moderna series) 11/12/2020   INFLUENZA VACCINE  02/23/2021   COLONOSCOPY (Pts 45-40yr Insurance coverage will need to be confirmed)  09/29/2021   MAMMOGRAM  03/20/2023   TETANUS/TDAP  01/30/2030   Pneumococcal Vaccine [id number] old  Aged Out   HPV VACCINES  Aged Out    Discussed health benefits of physical activity, and encouraged her to engage in regular exercise appropriate for her age and condition.  1. Annual physical exam General health stable. Will get shingles and pneumonia vaccinations at her pharmacy.  2. Essential hypertension Has been off the Lisinopril and Torsemide the past several days. Recheck labs and restart meds. Follow up pending lab reports. - CBC with Differential/Platelet - Comprehensive metabolic panel - Lipid panel - TSH  3. Peripheral edema 2-3+ pitting edema of lower legs. Been off the Torsemide (getting medication refill today) for the past several days. Restrict sodium intake. History of echocardiogram evaluation by pulmonologist on 08-22-20 and EF 60-65%. Recheck labs. - CBC with Differential/Platelet - Comprehensive metabolic panel - TSH  4. History of lumbar laminectomy HNP in 1985, repeat in 2014 with repeat in 12 weeks with complication (Dr. HLuiz Ochoa. Stable without radiculopathy today.  5. Lipid screening - Comprehensive metabolic panel - Lipid panel - TSH  6. Screening for  HIV (human immunodeficiency virus) - HIV Antibody (routine testing w rflx)  7. Need for hepatitis C screening test - Hepatitis C antibody   No follow-ups on file.     I, Jaben Benegas, PA-C, have reviewed all documentation for this visit. The documentation on 03/24/21 for the exam, diagnosis, procedures, and orders are all accurate and complete.    Vernie Murders, PA-C  [employer] [phone]  (phone) [phone] (fax)  [place]

## 2021-03-25 LAB — CBC WITH DIFFERENTIAL/PLATELET
Basophils Absolute: 0.1 10*3/uL (ref 0.0–0.2)
Basos: 1 %
EOS (ABSOLUTE): 0.2 10*3/uL (ref 0.0–0.4)
Eos: 2 %
Hematocrit: 40.6 % (ref 34.0–46.6)
Hemoglobin: 13.3 g/dL (ref 11.1–15.9)
Immature Grans (Abs): 0 10*3/uL (ref 0.0–0.1)
Immature Granulocytes: 0 %
Lymphocytes Absolute: 2.9 10*3/uL (ref 0.7–3.1)
Lymphs: 31 %
MCH: 27.7 pg (ref 26.6–33.0)
MCHC: 32.8 g/dL (ref 31.5–35.7)
MCV: 84 fL (ref 79–97)
Monocytes Absolute: 1 10*3/uL — ABNORMAL HIGH (ref 0.1–0.9)
Monocytes: 11 %
Neutrophils Absolute: 4.9 10*3/uL (ref 1.4–7.0)
Neutrophils: 55 %
Platelets: 278 10*3/uL (ref 150–450)
RBC: 4.81 x10E6/uL (ref 3.77–5.28)
RDW: 14.2 % (ref 11.7–15.4)
WBC: 9.1 10*3/uL (ref 3.4–10.8)

## 2021-03-25 LAB — COMPREHENSIVE METABOLIC PANEL
ALT: 24 IU/L (ref 0–32)
AST: 19 IU/L (ref 0–40)
Albumin/Globulin Ratio: 2 (ref 1.2–2.2)
Albumin: 4.4 g/dL (ref 3.8–4.8)
Alkaline Phosphatase: 84 IU/L (ref 44–121)
BUN/Creatinine Ratio: 21 (ref 12–28)
BUN: 12 mg/dL (ref 8–27)
Bilirubin Total: 0.3 mg/dL (ref 0.0–1.2)
CO2: 22 mmol/L (ref 20–29)
Calcium: 10.3 mg/dL (ref 8.7–10.3)
Chloride: 104 mmol/L (ref 96–106)
Creatinine, Ser: 0.57 mg/dL (ref 0.57–1.00)
Globulin, Total: 2.2 g/dL (ref 1.5–4.5)
Glucose: 82 mg/dL (ref 65–99)
Potassium: 5 mmol/L (ref 3.5–5.2)
Sodium: 140 mmol/L (ref 134–144)
Total Protein: 6.6 g/dL (ref 6.0–8.5)
eGFR: 101 mL/min/{1.73_m2} (ref >59)

## 2021-03-25 LAB — LIPID PANEL
Chol/HDL Ratio: 3.2 ratio (ref 0.0–4.4)
Cholesterol, Total: 188 mg/dL (ref 100–199)
HDL: 59 mg/dL (ref >39)
LDL Chol Calc (NIH): 109 mg/dL — ABNORMAL HIGH (ref 0–99)
Triglycerides: 110 mg/dL (ref 0–149)
VLDL Cholesterol Cal: 20 mg/dL (ref 5–40)

## 2021-03-25 LAB — TSH: TSH: 2.46 u[IU]/mL (ref 0.450–4.500)

## 2021-03-25 LAB — HIV ANTIBODY (ROUTINE TESTING W REFLEX): HIV Screen 4th Generation wRfx: NONREACTIVE

## 2021-03-25 LAB — HEPATITIS C ANTIBODY: Hep C Virus Ab: <0.1 s/co ratio (ref 0.0–0.9)

## 2021-05-26 ENCOUNTER — Other Ambulatory Visit: Payer: Self-pay

## 2021-05-26 ENCOUNTER — Telehealth: Payer: Self-pay

## 2021-05-26 DIAGNOSIS — R609 Edema, unspecified: Secondary | ICD-10-CM

## 2021-05-26 MED ORDER — TORSEMIDE 20 MG PO TABS: ORAL_TABLET | ORAL | 1 refills | Status: DC

## 2021-05-26 NOTE — Telephone Encounter (Signed)
[facility] faxed refill request for the following medications:  torsemide (DEMADEX) 20 MG tablet  Last Rx: 03/20/21 LOV: 03/24/21 with Simona Huh NOV: no appts scheduled at this time Please advise. Thanks TNP

## 2021-05-29 ENCOUNTER — Telehealth: Payer: Self-pay | Admitting: Family Medicine

## 2021-05-29 DIAGNOSIS — I10 Essential (primary) hypertension: Secondary | ICD-10-CM

## 2021-05-29 MED ORDER — LISINOPRIL 2.5 MG PO TABS: 2.5000 mg | ORAL_TABLET | Freq: Every day | ORAL | 3 refills | Status: DC

## 2021-05-29 NOTE — Telephone Encounter (Signed)
[facility] faxed refill request for the following medications:   lisinopril (ZESTRIL) 2.5 MG tablet   Please advise.

## 2021-05-29 NOTE — Addendum Note (Signed)
Addended by: Julieta Bellini on: 05/29/2021 09:14 AM   Modules accepted: Orders

## 2021-07-02 ENCOUNTER — Encounter: Payer: Self-pay | Admitting: Pulmonary Disease

## 2021-07-02 ENCOUNTER — Ambulatory Visit (INDEPENDENT_AMBULATORY_CARE_PROVIDER_SITE_OTHER): Payer: 59 | Admitting: Pulmonary Disease

## 2021-07-02 ENCOUNTER — Other Ambulatory Visit: Payer: Self-pay

## 2021-07-02 VITALS — BP 144/84 | HR 77 | Temp 98.2°F | Ht 66.0 in | Wt 294.6 lb

## 2021-07-02 DIAGNOSIS — I10 Essential (primary) hypertension: Secondary | ICD-10-CM | POA: Diagnosis not present

## 2021-07-02 DIAGNOSIS — R0982 Postnasal drip: Secondary | ICD-10-CM

## 2021-07-02 DIAGNOSIS — R0602 Shortness of breath: Secondary | ICD-10-CM

## 2021-07-02 DIAGNOSIS — J683 Other acute and subacute respiratory conditions due to chemicals, gases, fumes and vapors: Secondary | ICD-10-CM | POA: Diagnosis not present

## 2021-07-02 MED ORDER — VALSARTAN 40 MG PO TABS: 40.0000 mg | ORAL_TABLET | Freq: Every day | ORAL | 2 refills | Status: DC

## 2021-07-02 NOTE — Patient Instructions (Addendum)
We have started you on a new blood pressure medication I have given you enough to last for 3 months this should give enough time so you can get a new primary care provider.  The medications name is valsartan (Diovan) and its 40 mg daily.  Hold off on taking the fluid pill when you start the Diovan initially.  May need to cut the fluid pill in half as you may not require as much of this.  Stop the Zestril I think that this is giving you a lot of problems with your sinuses and cough.  We will see YOU in follow-up in 2 to 3 months time you may see me or the nurse practitioner at that time depending on her schedule.   Consider looking at sinus rinses.  You would have to do these with distilled water.

## 2021-07-02 NOTE — Progress Notes (Signed)
Subjective:    Patient ID: [name], female    DOB: Jan 18, 1956, 65 y.o.   MRN: [id number] Chief Complaint  Patient presents with   Follow-up    Sob with exertion and dry cough.    HPI [name] is a 65 year old lifelong never smoker presents for follow-up on the issue of dyspnea.  This is related mostly to obesity and deconditioning.  We last saw her on 22 September 2020.  This is a scheduled visit.  At her prior visit she was switched to Pulmicort 180 mcg 1 puff twice a day which she notes control of her cough fairly well.  She continues to have some issues with dry cough.  Note is made that she is on an ACE inhibitor.  She also notices frequent postnasal discharge and is currently on azelastine.  She does not do sinus washes.  She also has significant issues with hypertension, hypervolemia and diastolic dysfunction.  She has had difficulties getting these issues addressed due to the change in her primary care providers due to personnel changes.  She is now trying to procure a primary provider.  She is still on an ACE inhibitor which has been recommended previously to discontinue.  Seems to struggle with weight BMI today is 47.55.  Previously did have issues with sleep disordered breathing but states that she now sleeps in a recliner and that this has not been an issue.  She does not want to undergo a sleep study as of yet citing economical reasons.  Dyspnea remains about the same usually not an issue unless walking hurriedly or for distances over 750 feet.  Does not endorse any other symptomatology today.  DATA: 07/14/2020 chest x-ray PA and lateral: Mild interstitial prominence hazy bilateral opacities (following COVID) 07/31/2020 chest x-ray PA and lateral: Mild chronic changes.  No acute process 08/22/2020 2D echo: LVEF 60 to 65%, DD grade I, left atrium moderately dilated mild dilatation of the aortic root, difficult imaging due to body habitus 08/29/2020 PFTs: 51 1.93 L or 73%  predicted, FVC 2.31 L or 67% predicted, FEV1/FVC 84%, lung volumes mildly reduced.  ERV 20%.  There is a small airways component.  Normal diffusion capacity.  This is consistent with mild restriction with small airways component likely on the basis of the CT with mild reactive airways disease.  Review of Systems A 10 point review of systems was performed and it is as noted above otherwise negative.  Patient Active Problem List   Diagnosis Date Noted   Absolute anemia 02/06/2015   Accumulation of fluid in tissues 02/06/2015   Abnormal liver enzymes 02/06/2015   Angioma 02/06/2015   Hydronephrosis 02/06/2015   Calcium blood increased 02/06/2015   Hepatic cyst 02/06/2015   Headache, migraine 02/06/2015   Avitaminosis D 02/06/2015   Avitaminosis 02/06/2015   Hyponatremia 01/29/2015   Social History   Tobacco Use   Smoking status: Never   Smokeless tobacco: Never  Substance Use Topics   Alcohol use: No   No Known Allergies  Current Meds  Medication Sig   Azelastine HCl 0.15 % SOLN Place 1 spray into the nose in the morning and at bedtime.   budesonide (PULMICORT FLEXHALER) 180 MCG/ACT inhaler Inhale 1 puff into the lungs in the morning and at bedtime.   lisinopril (ZESTRIL) 2.5 MG tablet Take 1 tablet (2.5 mg total) by mouth daily.   torsemide (DEMADEX) 20 MG tablet TAKE 1 TABLET(20 MG) BY MOUTH DAILY   Immunization History  Administered Date(s)  Administered   Influenza,inj,Quad PF,6+ Mos 05/12/2018   Influenza-Unspecified 05/15/2020, 04/27/2021   Moderna SARS-COV2 Booster Vaccination 07/14/2020   Moderna Sars-Covid-2 Vaccination 10/13/2019, 11/10/2019, 03/26/2021   Tdap 04/10/2009, 01/31/2020       Objective:   Physical Exam BP (!) 144/84 (BP Location: Left Arm, Cuff Size: Large)   Pulse 77   Temp 98.2 F (36.8 C) (Temporal)   Ht 5\' 6"  (1.676 m)   Wt 294 lb 9.6 oz (133.6 kg)   SpO2 98%   BMI 47.55 kg/m  GENERAL: Morbidly obese woman, no acute distress no  conversational dyspnea, fully ambulatory. HEAD: Normocephalic, atraumatic.  EYES: Pupils equal, round, reactive to light.  No scleral icterus.  MOUTH: Nose/mouth/throat not examined due to masking requirements for COVID 19. NECK: Supple. No thyromegaly. Trachea midline. No JVD.  No adenopathy. PULMONARY: Good air entry bilaterally.  No adventitious sounds. CARDIOVASCULAR: S1 and S2. Regular rate and rhythm.  Grade 2/6 systolic ejection murmur over the aortic area. ABDOMEN: Obese, benign. MUSCULOSKELETAL: No joint deformity, no clubbing, there is 2+ to 3+ edema of the lower extremities to the level of mid shin.  No calf tenderness. NEUROLOGIC: No overt focal deficit, speech is fluent, no gait disturbance. SKIN: Intact,warm,dry.  Mental exam no rashes. PSYCH: Mood and behavior normal       Assessment & Plan:     ICD-10-CM   1. Shortness of breath  R06.02    Mostly secondary to obesity and deconditioning Element of diastolic dysfunction Mild airways reactivity    2. Reactive airways dysfunction syndrome (HCC)  J68.3    Continue Pulmicort Twisthaler 180 mcg twice daily     3. Post-nasal discharge  R09.82    Continue azelastine Recommended nasal rinses [facility] booklet provided    4. Benign essential HTN  I10 Ambulatory referral to Internal Medicine   Discontinue ACE inhibitor Start Diovan 40 mg daily Hold Demadex on Diovan start Referral to internal medicine     Meds ordered this encounter  Medications   valsartan (DIOVAN) 40 MG tablet    Sig: Take 1 tablet (40 mg total) by mouth daily.    Dispense:  30 tablet    Refill:  2   We will see the patient in follow-up in 2 to 3 months time she is to contact us prior to that time should any new difficulties arise.  Renold Don, MD Advanced Bronchoscopy PCCM [facility]    *This note was dictated using voice recognition software/Dragon.  Despite best efforts to proofread, errors can occur which can  change the meaning.  Any change was purely unintentional.

## 2021-09-03 ENCOUNTER — Encounter: Payer: Self-pay | Admitting: Pulmonary Disease

## 2021-09-03 ENCOUNTER — Other Ambulatory Visit: Payer: Self-pay

## 2021-09-03 ENCOUNTER — Ambulatory Visit: Payer: 59 | Admitting: Pulmonary Disease

## 2021-09-03 VITALS — BP 130/80 | HR 92 | Temp 98.1°F | Ht 66.0 in | Wt 297.4 lb

## 2021-09-03 DIAGNOSIS — I10 Essential (primary) hypertension: Secondary | ICD-10-CM | POA: Diagnosis not present

## 2021-09-03 DIAGNOSIS — R053 Chronic cough: Secondary | ICD-10-CM | POA: Diagnosis not present

## 2021-09-03 DIAGNOSIS — R0982 Postnasal drip: Secondary | ICD-10-CM

## 2021-09-03 DIAGNOSIS — Z6841 Body Mass Index (BMI) 40.0 and over, adult: Secondary | ICD-10-CM

## 2021-09-03 DIAGNOSIS — J683 Other acute and subacute respiratory conditions due to chemicals, gases, fumes and vapors: Secondary | ICD-10-CM

## 2021-09-03 MED ORDER — OLMESARTAN MEDOXOMIL 5 MG PO TABS: 5.0000 mg | ORAL_TABLET | Freq: Every day | ORAL | 2 refills | Status: DC

## 2021-09-03 NOTE — Patient Instructions (Signed)
I recommend you take Zyrtec at bedtime.  We are changing your blood pressure medicine to olmesartan (Benicar) 5 mg daily, this may need to be adjusted up but I have started you on the lowest dose.  I recommend you make an appointment with [facility] family [facility].  We will see you in follow-up in 6 to 8 weeks time call sooner should any new problems arise.

## 2021-09-03 NOTE — Progress Notes (Signed)
Subjective:    Patient ID: [name], female    DOB: 1955-11-25, 66 y.o.   MRN: [id number] Chief Complaint  Patient presents with   Follow-up    HPI [name] is a 66 year old lifelong never smoker presents for follow-up on the issue of dyspnea.  This is related mostly to obesity and deconditioning.  We last saw her on 02 July 2021.  This is a scheduled visit.  At her prior visit she continued to use Pulmicort 180 mcg 1 puff twice a day which she notes control of her cough fairly well.  She was switched from an ACE inhibitor to losartan (she is still procuring primary care physician) however noted diarrhea with the losartan.  She discontinued this and went back to lisinopril.  She continues to have some issues with dry cough.  She also notices frequent postnasal discharge and is currently on azelastine. She also has significant issues with hypertension, hypervolemia and diastolic dysfunction.  She has had difficulties getting these issues addressed due to the change in her primary care providers due to personnel changes.  She has not been successful in procuring a primary provider.   She continues to struggle with weight , BMI today is 48.  Previously did have issues with sleep disordered breathing but states that she now  sleeps in a recliner and that this has not been an issue.  She does not want to undergo a sleep study citing economic reasons.   Dyspnea remains about the same usually not an issue unless walking hurriedly or for distances over 750 feet.  This is mostly an issue with deconditioning.   Does not endorse any other symptomatology today.     DATA: 07/14/2020 chest x-ray PA and lateral: Mild interstitial prominence hazy bilateral opacities (following COVID) 07/31/2020 chest x-ray PA and lateral: Mild chronic changes.  No acute process 08/22/2020 2D echo: LVEF 60 to 65%, DD grade I, left atrium moderately dilated mild dilatation of the aortic root, difficult imaging due to body habitus 08/29/2020 PFTs: 51 1.93 L or 73% predicted, FVC 2.31 L or 67% predicted, FEV1/FVC 84%, lung volumes mildly reduced.  ERV 20%.  There is a small airways component.  Normal diffusion capacity.  This is consistent with mild restriction with small airways component likely on the basis of obesity with mild reactive airways disease.  Review of Systems A 10 point review of systems was performed and it is as noted above otherwise negative.  Patient Active Problem List   Diagnosis Date Noted   Absolute anemia 02/06/2015   Accumulation of fluid in tissues 02/06/2015   Abnormal liver enzymes 02/06/2015   Angioma 02/06/2015   Hydronephrosis 02/06/2015   Calcium blood increased 02/06/2015   Hepatic cyst 02/06/2015   Headache, migraine 02/06/2015   Avitaminosis D 02/06/2015   Avitaminosis 02/06/2015   Hyponatremia 01/29/2015   Social History   Tobacco Use   Smoking status: Never   Smokeless tobacco: Never  Substance Use Topics   Alcohol use: No   No Known Allergies  Current Meds  Medication Sig   Azelastine HCl 0.15 % SOLN Place 1 spray into the nose in the morning and at bedtime.   budesonide (PULMICORT FLEXHALER) 180 MCG/ACT inhaler Inhale 1 puff into the lungs in the morning and at bedtime.   lisinopril  (ZESTRIL) 2.5 MG tablet Take 2.5 mg by mouth daily.   torsemide (DEMADEX) 20 MG tablet TAKE 1 TABLET(20 MG) BY MOUTH DAILY   Immunization History  Administered Date(s)  Administered   Influenza,inj,Quad PF,6+ Mos 05/12/2018   Influenza-Unspecified 05/15/2020, 04/27/2021   Moderna SARS-COV2 Booster Vaccination 07/14/2020   Moderna Sars-Covid-2 Vaccination 10/13/2019, 11/10/2019, 03/26/2021   Tdap 04/10/2009, 01/31/2020      Objective:   Physical Exam BP 130/80 (BP Location: Left Arm, Patient Position: Sitting, Cuff Size: Normal)    Pulse 92    Temp 98.1 F (36.7 C) (Oral)    Ht 5\' 6"  (1.676 m)    Wt 297 lb 6.4 oz (134.9 kg)    SpO2 97%    BMI 48.00 kg/m   GENERAL: Morbidly obese woman, no acute distress no conversational dyspnea, fully ambulatory. HEAD: Normocephalic, atraumatic.  EYES: Pupils equal, round, reactive to light.  No scleral icterus.  MOUTH: Nose/mouth/throat not examined due to masking requirements for COVID 19. NECK: Supple. No thyromegaly. Trachea midline. No JVD.  No adenopathy. PULMONARY: Good air entry bilaterally.  No adventitious sounds. CARDIOVASCULAR: S1 and S2. Regular rate and rhythm.  Grade 2/6 systolic ejection murmur over the aortic area. ABDOMEN: Obese, benign. MUSCULOSKELETAL: No joint deformity, no clubbing, there is 2+ to 3+ edema of the lower extremities to the level of mid shin.  No calf tenderness. NEUROLOGIC: No overt focal deficit, speech is fluent, no gait disturbance. SKIN: Intact,warm,dry.  Mental exam no rashes. PSYCH: Mood and behavior normal      Assessment & Plan:     ICD-10-CM   1. Chronic cough  R05.3    Mostly related to mild reactive airways disease She is on ACE inhibitor Switch to ARB (Benicar)    2. Reactive airways dysfunction syndrome (HCC)  J68.3    Continue Pulmicort Flexhaler Appears to be better controlled    3. Post-nasal discharge  R09.82    Trial of Zyrtec at bedtime This issue adds to her problems with chronic  cough    4. Benign essential HTN  I10    Benicar 5 mg daily Recommend make appointment with [facility]    5. Morbid obesity with BMI of 45.0-49.9, adult (HCC)  E66.01    Z68.42    Weight loss recommended This issue adds to her sensation of dyspnea This issue adds complexity to her management     Meds ordered this encounter  Medications   olmesartan (BENICAR) 5 MG tablet    Sig: Take 1 tablet (5 mg total) by mouth daily.    Dispense:  30 tablet    Refill:  2   We will see the patient in follow-up in 6 to 8 weeks time she is to contact us prior to that time should any new difficulties arise   C. Derrill Kay, MD Advanced Bronchoscopy PCCM [facility]    *This note was dictated using voice recognition software/Dragon.  Despite best efforts to proofread, errors can occur which can change the meaning. Any transcriptional errors that result from this process are unintentional and may not be fully corrected at the time of dictation.

## 2021-09-08 ENCOUNTER — Encounter: Payer: Self-pay | Admitting: Pulmonary Disease

## 2021-10-02 ENCOUNTER — Other Ambulatory Visit: Payer: Self-pay

## 2021-10-02 ENCOUNTER — Ambulatory Visit
Admission: RE | Admit: 2021-10-02 | Discharge: 2021-10-02 | Disposition: A | Discharge status: Home / Self Care | Payer: 59 | Source: Ambulatory Visit | Attending: Family Medicine | Admitting: Family Medicine

## 2021-10-02 ENCOUNTER — Encounter: Payer: Self-pay | Admitting: Family Medicine

## 2021-10-02 ENCOUNTER — Ambulatory Visit: Payer: 59 | Admitting: Family Medicine

## 2021-10-02 ENCOUNTER — Ambulatory Visit: Payer: Self-pay

## 2021-10-02 VITALS — BP 168/83 | HR 98 | Temp 98.4°F | Ht 66.0 in | Wt 307.6 lb

## 2021-10-02 DIAGNOSIS — I10 Essential (primary) hypertension: Secondary | ICD-10-CM | POA: Insufficient documentation

## 2021-10-02 DIAGNOSIS — R053 Chronic cough: Secondary | ICD-10-CM | POA: Insufficient documentation

## 2021-10-02 DIAGNOSIS — R609 Edema, unspecified: Secondary | ICD-10-CM

## 2021-10-02 DIAGNOSIS — J4541 Moderate persistent asthma with (acute) exacerbation: Secondary | ICD-10-CM

## 2021-10-02 DIAGNOSIS — J45909 Unspecified asthma, uncomplicated: Secondary | ICD-10-CM | POA: Insufficient documentation

## 2021-10-02 MED ORDER — MONTELUKAST SODIUM 10 MG PO TABS: 10.0000 mg | ORAL_TABLET | Freq: Every day | ORAL | 3 refills | Status: DC

## 2021-10-02 MED ORDER — PREDNISONE 5 MG PO TABS: 5.0000 mg | ORAL_TABLET | Freq: Every day | ORAL | 0 refills | Status: DC

## 2021-10-02 MED ORDER — CETIRIZINE HCL 10 MG PO TABS: 10.0000 mg | ORAL_TABLET | Freq: Every day | ORAL | 3 refills | Status: AC

## 2021-10-02 NOTE — Progress Notes (Signed)
?  ? ?I,Sha'taria Tyson,acting as a scribe for Gwyneth Sprout, FNP.,have documented all relevant documentation on the behalf of Gwyneth Sprout, FNP,as directed by  Gwyneth Sprout, FNP while in the presence of Gwyneth Sprout, FNP. ? ? ?Established patient visit ? ? ?Patient: [name]   DOB: [birth year]   66 y.o. Female  MRN: [id number] ?Visit Date: 10/02/2021 ? ?Today's healthcare provider: Gwyneth Sprout, FNP  ? ?Introduced to Designer, jewellery role and practice setting.  All questions answered.  Discussed provider/patient relationship and expectations. ? ? ?Chief Complaint  ?Patient presents with  ? Cough  ? Sore Throat  ? ?Subjective  ?  ?HPI  ?Patient reports cough and sore throat for 3 days. Productive cough with green phlegm, reports being able to feel in her chest this morning along with body aches and drainage but no other symptoms. No fever that she has been aware of. Reports she took 2 advil yesterday and one Nyquil gel capsule last night. No contact with anyone covid positive or strep. No covid test taken. Reports she has not taken any of her medications today.  ? ?Medications: ?Outpatient Medications Prior to Visit  ?Medication Sig  ? Azelastine HCl 0.15 % SOLN Place 1 spray into the nose in the morning and at bedtime.  ? budesonide (PULMICORT FLEXHALER) 180 MCG/ACT inhaler Inhale 1 puff into the lungs in the morning and at bedtime.  ? olmesartan (BENICAR) 5 MG tablet Take 1 tablet (5 mg total) by mouth daily.  ? torsemide (DEMADEX) 20 MG tablet TAKE 1 TABLET(20 MG) BY MOUTH DAILY  ? ?No facility-administered medications prior to visit.  ? ? ?Review of Systems ?COUGH ?Duration: 3 days ?Circumstances of initial development of cough: nothing ?Cough severity: mild ?Cough description: productive, wet, and irritating ?Aggravating factors:  worse in the AM and exercise ?Alleviating factors: nothing ?Status:  worse ?Treatments attempted: cold/sinus and mucinex ?Wheezing: no ?Shortness of breath: yes ?Chest  pain: no ?Chest tightness:yes ?Nasal congestion: no ?Runny nose: yes ?Postnasal drip: yes ?Frequent throat clearing or swallowing: no ?Hemoptysis: no ?Fevers: no ?Night sweats: no ?Weight loss: no ?Heartburn: no ?Recent foreign travel: no ?Tuberculosis contacts: no ? ?SHORTNESS OF BREATH ?Duration:  ?Onset: sudden ?Description of breathing discomfort:  ?Severity: moderate ?Episode duration: 3 days; hx of chronic cough, f/b pulm ?Frequency: constant ?Related to exertion: yes ?Cough: yes productive ?Chest tightness: yes ?Wheezing: no ?Fevers: no ?Chest pain: no ?Palpitations: no  ?Nausea: no ?Diaphoresis: no ?Deconditioning: yes ?Status: worse ?Aggravating factors: activity ?Alleviating factors: Nyquil, assisted with sleep ?Treatments attempted: Nyquil, see above ? ? ? ?  Objective  ?  ?BP (!) 168/83 (BP Location: Right Arm, Patient Position: Sitting, Cuff Size: Large)   Pulse 98   Temp 98.4 ?F (36.9 ?C) (Temporal)   Ht '5\' 6"'$  (1.676 m)   Wt (!) 307 lb 9.6 oz (139.5 kg)   SpO2 100%   BMI 49.65 kg/m?  ? ? ?Physical Exam ?Vitals and nursing note reviewed.  ?Constitutional:   ?   General: She is not in acute distress. ?   Appearance: Normal appearance. She is well-developed. She is obese. She is not ill-appearing, toxic-appearing or diaphoretic.  ?HENT:  ?   Head: Normocephalic and atraumatic.  ?   Right Ear: Tympanic membrane and ear canal normal. No drainage, swelling or tenderness. No middle ear effusion. Tympanic membrane is not erythematous.  ?   Left Ear: Tympanic membrane and ear canal normal. No drainage, swelling or tenderness.  No middle ear effusion. Tympanic membrane is not erythematous.  ?   Nose: Rhinorrhea present. No congestion.  ?   Mouth/Throat:  ?   Mouth: Mucous membranes are moist. No oral lesions.  ?   Pharynx: Oropharynx is clear. Uvula midline. No pharyngeal swelling, oropharyngeal exudate, posterior oropharyngeal erythema or uvula swelling.  ?   Tonsils: No tonsillar exudate or tonsillar  abscesses.  ?Eyes:  ?   Extraocular Movements:  ?   Right eye: Normal extraocular motion.  ?   Left eye: Normal extraocular motion.  ?   Conjunctiva/sclera: Conjunctivae normal.  ?   Pupils: Pupils are equal, round, and reactive to light.  ?Cardiovascular:  ?   Rate and Rhythm: Normal rate and regular rhythm.  ?   Pulses: Normal pulses.  ?   Heart sounds: Normal heart sounds. No murmur heard. ?  No friction rub. No gallop.  ?Pulmonary:  ?   Effort: Pulmonary effort is normal. No respiratory distress.  ?   Breath sounds: Decreased air movement present. No stridor. Examination of the right-middle field reveals decreased breath sounds. Examination of the left-middle field reveals decreased breath sounds. Examination of the right-lower field reveals decreased breath sounds. Examination of the left-lower field reveals decreased breath sounds. Decreased breath sounds present. No wheezing, rhonchi or rales.  ?Chest:  ?   Chest wall: No tenderness.  ?Abdominal:  ?   General: Bowel sounds are normal.  ?   Palpations: Abdomen is soft.  ?Musculoskeletal:     ?   General: No swelling, tenderness, deformity or signs of injury. Normal range of motion.  ?   Cervical back: Normal range of motion and neck supple.  ?   Right lower leg: No edema.  ?   Left lower leg: No edema.  ?Skin: ?   General: Skin is warm and dry.  ?   Capillary Refill: Capillary refill takes less than 2 seconds.  ?   Coloration: Skin is not jaundiced or pale.  ?   Findings: No bruising, erythema, lesion or rash.  ?Neurological:  ?   General: No focal deficit present.  ?   Mental Status: She is alert and oriented to person, place, and time. Mental status is at baseline.  ?   Cranial Nerves: No cranial nerve deficit.  ?   Sensory: No sensory deficit.  ?   Motor: No weakness.  ?   Coordination: Coordination normal.  ?Psychiatric:     ?   Mood and Affect: Mood normal.     ?   Behavior: Behavior normal.     ?   Thought Content: Thought content normal.     ?    Judgment: Judgment normal.  ?  ? ?No results found for any visits on 10/02/21. ? Assessment & Plan  ?  ? ?Problem List Items Addressed This Visit   ? ?  ? Cardiovascular and Mediastinum  ? Essential hypertension  ? Relevant Orders  ? Ambulatory referral to Cardiology  ?  ? Respiratory  ? Reactive airway disease - Primary  ? Relevant Medications  ? montelukast (SINGULAIR) 10 MG tablet  ? cetirizine (ZYRTEC) 10 MG tablet  ? predniSONE (DELTASONE) 5 MG tablet  ? Other Relevant Orders  ? DG Chest 2 View  ?  ? Other  ? Chronic cough  ? Relevant Orders  ? Ambulatory referral to Cardiology  ? Morbid obesity ([place])  ? Relevant Orders  ? Ambulatory referral to Cardiology  ? Peripheral edema  ?  Relevant Orders  ? Ambulatory referral to Cardiology  ? ? ? ?Return in about 6 weeks (around 11/13/2021) for chonic disease management.  ?   ?I, Gwyneth Sprout, FNP, have reviewed all documentation for this visit. The documentation on 10/02/21 for the exam, diagnosis, procedures, and orders are all accurate and complete. ? ? ? ?Gwyneth Sprout, FNP  ?[place] ?[phone] (phone) ?[phone] (fax) ? ?[facility] ? ?

## 2021-10-02 NOTE — Telephone Encounter (Signed)
Summary: cold and flu like symptoms  ? The patient would like to be tested for cold and flu  ? ?The patient has experienced a cough, runny nose and sore throat since Wednesday 09/30/21  ? ?Please contact further when possible   ?  ? ?Chief Complaint: Cough, sinus pressure, sore throat ?Symptoms: Above ?Frequency: Started 09/30/21 ?Pertinent Negatives: Patient denies fever ?Disposition: '[]'$ ED /'[]'$ Urgent Care (no appt availability in office) / '[x]'$ Appointment(In office/virtual)/ '[]'$  [facility]/ '[]'$ Home Care/ '[]'$ Refused Recommended Disposition /'[]'$ [facility]/ '[]'$  Follow-up with PCP ?Additional Notes:   ?Reason for Disposition ? [1] MILD difficulty breathing (e.g., minimal/no SOB at rest, SOB with walking, pulse <100) AND [2] still present when not coughing ? ?Answer Assessment - Initial Assessment Questions ?1. ONSET: "When did the cough begin?"  ?    Wednesday ?2. SEVERITY: "How bad is the cough today?"  ?    Moderate ?3. SPUTUM: "Describe the color of your sputum" (none, dry cough; clear, white, yellow, green) ?    Yellow at times ?4. HEMOPTYSIS: "Are you coughing up any blood?" If so ask: "How much?" (flecks, streaks, tablespoons, etc.) ?    No ?5. DIFFICULTY BREATHING: "Are you having difficulty breathing?" If Yes, ask: "How bad is it?" (e.g., mild, moderate, severe)  ?  - MILD: No SOB at rest, mild SOB with walking, speaks normally in sentences, can lie down, no retractions, pulse < 100.  ?  - MODERATE: SOB at rest, SOB with minimal exertion and prefers to sit, cannot lie down flat, speaks in phrases, mild retractions, audible wheezing, pulse 100-120.  ?  - SEVERE: Very SOB at rest, speaks in single words, struggling to breathe, sitting hunched forward, retractions, pulse > 120  ?    Mild ?6. FEVER: "Do you have a fever?" If Yes, ask: "What is your temperature, how was it measured, and when did it start?" ?    No ?7. CARDIAC HISTORY: "Do you have any history of heart disease?" (e.g., heart attack,  congestive heart failure)  ?    No ?8. LUNG HISTORY: "Do you have any history of lung disease?"  (e.g., pulmonary embolus, asthma, emphysema) ?    Yes ?9. PE RISK FACTORS: "Do you have a history of blood clots?" (or: recent major surgery, recent prolonged travel, bedridden) ?    No ?10. OTHER SYMPTOMS: "Do you have any other symptoms?" (e.g., runny nose, wheezing, chest pain) ?      Runny nose, sore throat ?11. PREGNANCY: "Is there any chance you are pregnant?" "When was your last menstrual period?" ?      No ?12. TRAVEL: "Have you traveled out of the country in the last month?" (e.g., travel history, exposures) ?      No ? ?Protocols used: Cough - Acute Productive-A-AH ? ?

## 2021-10-07 NOTE — Progress Notes (Signed)
? ?  ?I,Elena D DeSanto,acting as a scribe for Gwyneth Sprout, FNP.,have documented all relevant documentation on the behalf of Gwyneth Sprout, FNP,as directed by  Gwyneth Sprout, FNP while in the presence of Gwyneth Sprout, FNP. ? ? ? ?Established patient visit ? ? ?Patient: [name]   DOB: 24-Apr-1956   66 y.o. Female  MRN: [id number] ?Visit Date: 10/08/2021 ? ?Today's healthcare provider: Gwyneth Sprout, FNP  ?Re Introduced to nurse practitioner role and practice setting.  All questions answered.  Discussed provider/patient relationship and expectations. ? ?Subjective  ?  ?HPI  ?Patient is a 66 year old female who presents for evaluation of congestion and possible sinus infection.  She was seen 1 week ago and diagnosed with reactive airway disease and was treated with Montelukast, Cetirizine and Prednisone.  She also had chest x-ray done and resulted as: ?  ?IMPRESSION: ?No active cardiopulmonary disease. ? ? ?Medications: ?Outpatient Medications Prior to Visit  ?Medication Sig  ? Azelastine HCl 0.15 % SOLN Place 1 spray into the nose in the morning and at bedtime.  ? budesonide (PULMICORT FLEXHALER) 180 MCG/ACT inhaler Inhale 1 puff into the lungs in the morning and at bedtime.  ? cetirizine (ZYRTEC) 10 MG tablet Take 1 tablet (10 mg total) by mouth daily.  ? montelukast (SINGULAIR) 10 MG tablet Take 1 tablet (10 mg total) by mouth at bedtime.  ? olmesartan (BENICAR) 5 MG tablet Take 1 tablet (5 mg total) by mouth daily.  ? torsemide (DEMADEX) 20 MG tablet TAKE 1 TABLET(20 MG) BY MOUTH DAILY  ? [DISCONTINUED] predniSONE (DELTASONE) 5 MG tablet Take 1 tablet (5 mg total) by mouth daily with breakfast.  ? ?No facility-administered medications prior to visit.  ? ? ?Review of Systems  ?Constitutional:  Negative for fever.  ?HENT:  Positive for congestion. Negative for ear discharge, ear pain, postnasal drip, rhinorrhea, sinus pressure, sinus pain, sneezing and sore throat.   ?Respiratory:  Positive for cough,  shortness of breath and wheezing.   ?Cardiovascular:  Negative for chest pain, palpitations and leg swelling.  ? ?Reports cough is production of yellow green sputum as is her nasal congestion.  ?  Objective  ?  ?BP 122/66 (BP Location: Left Arm, Patient Position: Sitting, Cuff Size: Large)   Pulse 94   Temp 97.6 ?F (36.4 ?C) (Oral)   Wt 295 lb (133.8 kg)   SpO2 97%   BMI 47.61 kg/m?  ? ? ?Physical Exam ?Vitals and nursing note reviewed.  ?Constitutional:   ?   General: She is not in acute distress. ?   Appearance: Normal appearance. She is obese. She is not ill-appearing, toxic-appearing or diaphoretic.  ?HENT:  ?   Head: Normocephalic and atraumatic.  ?   Right Ear: Tympanic membrane, ear canal and external ear normal. There is no impacted cerumen.  ?   Left Ear: Tympanic membrane, ear canal and external ear normal. There is no impacted cerumen.  ?   Nose: Congestion and rhinorrhea present.  ?   Mouth/Throat:  ?   Mouth: Mucous membranes are moist.  ?   Pharynx: No oropharyngeal exudate or posterior oropharyngeal erythema.  ?Eyes:  ?   Pupils: Pupils are equal, round, and reactive to light.  ?Cardiovascular:  ?   Rate and Rhythm: Normal rate and regular rhythm.  ?   Pulses: Normal pulses.  ?   Heart sounds: Heart sounds are distant. No murmur heard. ?  No friction rub. No gallop.  ?  Pulmonary:  ?   Effort: Pulmonary effort is normal. No respiratory distress.  ?   Breath sounds: Decreased air movement present. No stridor. Examination of the right-upper field reveals wheezing. Examination of the left-upper field reveals wheezing. Examination of the right-middle field reveals wheezing. Examination of the left-middle field reveals wheezing. Examination of the right-lower field reveals decreased breath sounds. Examination of the left-lower field reveals decreased breath sounds. Decreased breath sounds and wheezing present. No rhonchi or rales.  ?Chest:  ?   Chest wall: No tenderness.  ?Musculoskeletal:     ?    General: Normal range of motion.  ?   Right lower leg: Edema present.  ?   Left lower leg: Edema present.  ?Skin: ?   General: Skin is warm and dry.  ?   Capillary Refill: Capillary refill takes less than 2 seconds.  ?Neurological:  ?   General: No focal deficit present.  ?   Mental Status: She is alert and oriented to person, place, and time. Mental status is at baseline.  ?   Cranial Nerves: No cranial nerve deficit.  ?   Sensory: No sensory deficit.  ?   Motor: No weakness.  ?   Coordination: Coordination normal.  ?Psychiatric:     ?   Mood and Affect: Mood normal.     ?   Behavior: Behavior normal.     ?   Thought Content: Thought content normal.     ?   Judgment: Judgment normal.  ?  ? ?No results found for any visits on 10/08/21. ? Assessment & Plan  ?  ? ?Problem List Items Addressed This Visit   ? ?  ? Cardiovascular and Mediastinum  ? Tortuous aorta (HCC)  ?  Seen on CXR 3/23, recommend referral to Cardiology for evaluation ? ?  ?  ?  ? Respiratory  ? Acute non-recurrent pansinusitis - Primary  ?  Will treat with Abx today ?Cold symptoms did not improve with use of anti histamine, use of Montelukast, and use of Azelastine spray ?Continue all other medications ?  ?  ? Relevant Medications  ? amoxicillin-clavulanate (AUGMENTIN) 875-125 MG tablet  ? azithromycin (ZITHROMAX) 250 MG tablet  ? Reactive airway disease  ?  RAD, on daily inhaler ?Will write for rescue inhaler ?Did not improve with use of steroid ?Encourage referral back to pulmonary  ?  ?  ?  ? Other  ? Chronic cough  ?  F/b pulm ?  ?  ? Chronic fatigue  ? Morbid obesity ([place])  ?  Body mass index is 47.61 kg/m?. ?Associated with chronic cough, RAD and breathing difficulty  ?Discussed importance of healthy weight management ?Discussed diet and exercise ? ?  ?  ? ? ? ?Return if symptoms worsen or fail to improve.  ?   ? ?I, Gwyneth Sprout, FNP, have reviewed all documentation for this visit. The documentation on 10/08/21 for the exam, diagnosis,  procedures, and orders are all accurate and complete. ? ? ? ?Gwyneth Sprout, FNP  ?[place] ?[phone] (phone) ?[phone] (fax) ? ?[facility] ?

## 2021-10-08 ENCOUNTER — Ambulatory Visit: Payer: 59 | Admitting: Family Medicine

## 2021-10-08 ENCOUNTER — Other Ambulatory Visit: Payer: Self-pay

## 2021-10-08 ENCOUNTER — Encounter: Payer: Self-pay | Admitting: Family Medicine

## 2021-10-08 VITALS — BP 122/66 | HR 94 | Temp 97.6°F | Wt 295.0 lb

## 2021-10-08 DIAGNOSIS — R5382 Chronic fatigue, unspecified: Secondary | ICD-10-CM | POA: Insufficient documentation

## 2021-10-08 DIAGNOSIS — R053 Chronic cough: Secondary | ICD-10-CM | POA: Diagnosis not present

## 2021-10-08 DIAGNOSIS — J0140 Acute pansinusitis, unspecified: Secondary | ICD-10-CM | POA: Insufficient documentation

## 2021-10-08 DIAGNOSIS — J4541 Moderate persistent asthma with (acute) exacerbation: Secondary | ICD-10-CM | POA: Diagnosis not present

## 2021-10-08 MED ORDER — AZITHROMYCIN 250 MG PO TABS: ORAL_TABLET | ORAL | 0 refills | Status: AC

## 2021-10-08 MED ORDER — DOXYCYCLINE MONOHYDRATE 100 MG PO TABS: 100.0000 mg | ORAL_TABLET | Freq: Two times a day (BID) | ORAL | 0 refills | Status: DC

## 2021-10-08 MED ORDER — ALBUTEROL SULFATE HFA 108 (90 BASE) MCG/ACT IN AERS: 2.0000 | INHALATION_SPRAY | Freq: Four times a day (QID) | RESPIRATORY_TRACT | 2 refills | Status: DC | PRN

## 2021-10-08 MED ORDER — AMOXICILLIN-POT CLAVULANATE 875-125 MG PO TABS: 1.0000 | ORAL_TABLET | Freq: Two times a day (BID) | ORAL | 0 refills | Status: DC

## 2021-10-08 NOTE — Assessment & Plan Note (Signed)
F/b pulm 

## 2021-10-08 NOTE — Assessment & Plan Note (Signed)
Body mass index is 47.61 kg/m?. ?Associated with chronic cough, RAD and breathing difficulty  ?Discussed importance of healthy weight management ?Discussed diet and exercise ? ?

## 2021-10-08 NOTE — Assessment & Plan Note (Signed)
Seen on CXR 3/23, recommend referral to Cardiology for evaluation ? ?

## 2021-10-08 NOTE — Assessment & Plan Note (Signed)
RAD, on daily inhaler ?Will write for rescue inhaler ?Did not improve with use of steroid ?Encourage referral back to pulmonary  ?

## 2021-10-08 NOTE — Assessment & Plan Note (Signed)
Will treat with Abx today ?Cold symptoms did not improve with use of anti histamine, use of Montelukast, and use of Azelastine spray ?Continue all other medications ?

## 2021-10-21 ENCOUNTER — Ambulatory Visit (INDEPENDENT_AMBULATORY_CARE_PROVIDER_SITE_OTHER): Payer: 59 | Admitting: Pulmonary Disease

## 2021-10-21 ENCOUNTER — Encounter: Payer: Self-pay | Admitting: Pulmonary Disease

## 2021-10-21 ENCOUNTER — Other Ambulatory Visit: Payer: Self-pay

## 2021-10-21 VITALS — BP 126/72 | HR 87 | Temp 97.7°F | Ht 66.0 in | Wt 297.6 lb

## 2021-10-21 DIAGNOSIS — R0982 Postnasal drip: Secondary | ICD-10-CM

## 2021-10-21 DIAGNOSIS — J683 Other acute and subacute respiratory conditions due to chemicals, gases, fumes and vapors: Secondary | ICD-10-CM | POA: Diagnosis not present

## 2021-10-21 DIAGNOSIS — R053 Chronic cough: Secondary | ICD-10-CM

## 2021-10-21 DIAGNOSIS — Z6841 Body Mass Index (BMI) 40.0 and over, adult: Secondary | ICD-10-CM

## 2021-10-21 MED ORDER — PULMICORT FLEXHALER 180 MCG/ACT IN AEPB
1.0000 | INHALATION_SPRAY | Freq: Two times a day (BID) | RESPIRATORY_TRACT | 11 refills | Status: AC
Start: 2021-10-21 — End: ?

## 2021-10-21 NOTE — Progress Notes (Signed)
? ?Subjective:  ? ? Patient ID: [name], female    DOB: 1955/09/15, 66 y.o.   MRN: [id number] ?Patient Care Team: ?Kristina Sprout, FNP as PCP - General (Family Medicine) ? ?Chief Complaint  ?Patient presents with  ? Follow-up  ? ? ?HPI ?[name] is a 66 year old lifelong never smoker who presents for follow-up on the issue of cough and dyspnea.  At her last visit on 03 September 2021 she had resume lisinopril after she had difficulties with losartan which caused her diarrhea.  She also had noted that her cough was still an issue though somewhat better with Pulmicort Flexhaler.  At that time we switched her to olmesartan and she has done well on this medication.  She also notes that her cough has pretty much resolved.  She was "sick" 2 weeks ago and was treated with antibiotic which also caused her diarrhea (this was Augmentin) but she notes that only 2 to 3 days of the antibiotic were enough to alleviate her symptoms.  Did have some cough during that episode however this has now resolved.  She also feels her shortness of breath is better.  She is trying to engage in weight loss but this has been somewhat difficult been trying for her.  She sleeps in a recliner so she has not had any issues with orthopnea, no paroxysmal nocturnal dyspnea.  States she is sleeping well.  Been recommended to her to have a sleep study however she has declined due to cost concerns.  She does not endorse any other symptomatology today. ? ?DATA: ?07/14/2020 chest x-ray PA and lateral: Mild interstitial prominence hazy bilateral opacities (following COVID) ?07/31/2020 chest x-ray PA and lateral: Mild chronic changes.  No acute process ?08/22/2020 2D echo: LVEF 60 to 65%, DD grade I, left atrium moderately dilated mild dilatation of the aortic root, difficult imaging due to body habitus ?08/29/2020 PFTs: 51 1.93 L or 73% predicted, FVC 2.31 L or 67% predicted, FEV1/FVC 84%, lung volumes mildly reduced.  ERV 20%.  There is a small airways  component.  Normal diffusion capacity.  This is consistent with mild restriction with small airways component likely on the basis of obesity with mild reactive airways disease. ?  ?Review of Systems ?A 10 point review of systems was performed and it is as noted above otherwise negative. ? ? ?Patient Active Problem List  ? Diagnosis Date Noted  ? Acute non-recurrent pansinusitis 10/08/2021  ? Chronic fatigue 10/08/2021  ? Tortuous aorta ([place]) 10/08/2021  ? Reactive airway disease 10/02/2021  ? Essential hypertension 10/02/2021  ? Peripheral edema 10/02/2021  ? Chronic cough 10/02/2021  ? Morbid obesity ([place]) 10/02/2021  ? Absolute anemia 02/06/2015  ? Accumulation of fluid in tissues 02/06/2015  ? Abnormal liver enzymes 02/06/2015  ? Angioma 02/06/2015  ? Hydronephrosis 02/06/2015  ? Calcium blood increased 02/06/2015  ? Hepatic cyst 02/06/2015  ? Headache, migraine 02/06/2015  ? Avitaminosis D 02/06/2015  ? Avitaminosis 02/06/2015  ? Hyponatremia 01/29/2015  ? ?Social History  ? ?Tobacco Use  ? Smoking status: Never  ? Smokeless tobacco: Never  ?Substance Use Topics  ? Alcohol use: No  ? ?No Known Allergies ? ?Current Meds  ?Medication Sig  ? albuterol (VENTOLIN HFA) 108 (90 Base) MCG/ACT inhaler Inhale 2 puffs into the lungs every 6 (six) hours as needed for wheezing or shortness of breath.  ? Azelastine HCl 0.15 % SOLN Place 1 spray into the nose in the morning and at bedtime.  ? cetirizine (  ZYRTEC) 10 MG tablet Take 1 tablet (10 mg total) by mouth daily.  ? olmesartan (BENICAR) 5 MG tablet Take 1 tablet (5 mg total) by mouth daily.  ? torsemide (DEMADEX) 20 MG tablet TAKE 1 TABLET(20 MG) BY MOUTH DAILY  ? budesonide (PULMICORT FLEXHALER) 180 MCG/ACT inhaler Inhale 1 puff into the lungs in the morning and at bedtime.  ? [DISCONTINUED] montelukast (SINGULAIR) 10 MG tablet Take 1 tablet (10 mg total) by mouth at bedtime.  ? ?Immunization History  ?Administered Date(s) Administered  ? Influenza,inj,Quad PF,6+ Mos  05/12/2018  ? Influenza-Unspecified 05/15/2020, 04/27/2021  ? Moderna SARS-COV2 Booster Vaccination 07/14/2020  ? Moderna Sars-Covid-2 Vaccination 10/13/2019, 11/10/2019, 03/26/2021  ? Tdap 04/10/2009, 01/31/2020  ? ? ?   ?Objective:  ? Physical Exam ?BP 126/72 (BP Location: Left Arm, Patient Position: Sitting, Cuff Size: Normal)   Pulse 87   Temp 97.7 ?F (36.5 ?C) (Oral)   Ht '5\' 6"'$  (1.676 m)   Wt 297 lb 9.6 oz (135 kg)   SpO2 98%   BMI 48.03 kg/m?  ?GENERAL: Morbidly obese woman, no acute distress no conversational dyspnea, fully ambulatory.  No throat clearing or cough noted. ?HEAD: Normocephalic, atraumatic.  ?EYES: No scleral icterus.  ?MOUTH: Oral mucosa moist. ?NECK: Supple. No thyromegaly. Trachea midline. No JVD.  No adenopathy. ?PULMONARY: Good air entry bilaterally.  No adventitious sounds. ?CARDIOVASCULAR: S1 and S2. Regular rate and rhythm.  Grade 2/6 systolic ejection murmur over the aortic area. ?ABDOMEN: Obese, benign. ?MUSCULOSKELETAL: No joint deformity, no clubbing, there is trace edema of the lower extremities. ?NEUROLOGIC: No overt focal deficit, speech is fluent, no gait disturbance. ?SKIN: Intact,warm,dry.  On limited exam no rashes. ?PSYCH: Mood and behavior normal ? ?   ?Assessment & Plan:  ? ?  ICD-10-CM   ?1. Reactive airways dysfunction syndrome ([place])  J68.3   ? Continue Pulmicort Flexhaler 180 mcg, 1 puff twice a day ?Continue as needed albuterol ?Consider decreasing Pulmicort on follow-up visit  ?  ?2. Chronic cough  R05.3   ? For the most part resolved ?She is off of ACE inhibitor and notes improvement ?Pulmicort Flexhaler also helps  ?  ?3. Post-nasal discharge  R09.82   ? Continue with nasal hygiene ?Continue Zyrtec at bedtime ?May stop Zyrtec after allergy season over  ?  ?4. Morbid obesity with BMI of 45.0-49.9, adult ([place])  E66.01   ? W65.68   ? This issue adds complexity to her management ?Continue to recommend weight loss  ?  ? ?Meds ordered this encounter  ?Medications  ?  budesonide (PULMICORT FLEXHALER) 180 MCG/ACT inhaler  ?  Sig: Inhale 1 puff into the lungs in the morning and at bedtime.  ?  Dispense:  1 each  ?  Refill:  11  ? ?We will see the patient in follow-up in 6 months time she is to contact us prior to that time should any new difficulties arise. ? ?C. Derrill Kay, MD ?Advanced Bronchoscopy ?[facility] ? ? ? ?*This note was dictated using voice recognition software/Dragon.  Despite best efforts to proofread, errors can occur which can change the meaning. Any transcriptional errors that result from this process are unintentional and may not be fully corrected at the time of dictation. ? ? ? ?

## 2021-10-21 NOTE — Patient Instructions (Signed)
Continue using her Pulmicort for now.  When we see you in follow-up we will see if we can discontinue this medication. ? ?Continue using the Zyrtec throughout the allergy season then you may come off of it and see how you do. ? ?We will see you in follow-up in 6 months time call sooner should any new problems arise. ?

## 2021-11-13 ENCOUNTER — Encounter: Payer: Self-pay | Admitting: Family Medicine

## 2021-11-13 ENCOUNTER — Ambulatory Visit (INDEPENDENT_AMBULATORY_CARE_PROVIDER_SITE_OTHER): Payer: 59 | Admitting: Family Medicine

## 2021-11-13 VITALS — BP 132/76 | HR 92 | Temp 99.0°F | Resp 16 | Ht 66.0 in | Wt 300.0 lb

## 2021-11-13 DIAGNOSIS — I10 Essential (primary) hypertension: Secondary | ICD-10-CM

## 2021-11-13 DIAGNOSIS — R609 Edema, unspecified: Secondary | ICD-10-CM

## 2021-11-13 DIAGNOSIS — I771 Stricture of artery: Secondary | ICD-10-CM

## 2021-11-13 DIAGNOSIS — Z1211 Encounter for screening for malignant neoplasm of colon: Secondary | ICD-10-CM | POA: Insufficient documentation

## 2021-11-13 DIAGNOSIS — Z13820 Encounter for screening for osteoporosis: Secondary | ICD-10-CM

## 2021-11-13 DIAGNOSIS — Z780 Asymptomatic menopausal state: Secondary | ICD-10-CM

## 2021-11-13 MED ORDER — TORSEMIDE 20 MG PO TABS: ORAL_TABLET | ORAL | 1 refills | Status: DC

## 2021-11-13 MED ORDER — OLMESARTAN MEDOXOMIL 5 MG PO TABS: 5.0000 mg | ORAL_TABLET | Freq: Every day | ORAL | 1 refills | Status: DC

## 2021-11-13 NOTE — Assessment & Plan Note (Signed)
Chronic, stable ?Denies CP ?Denies SOB/ DOE ?Denies low blood pressure/hypotension ?Denies vision changes ?No LE Edema noted on exam ?Continue medication, Benicar 5 mg; Torsemide '20mg'$   ?Denies side effects ?RTC 4 months  ?Seek emergent care if you develop chest pain or chest pressure ? ?

## 2021-11-13 NOTE — Assessment & Plan Note (Signed)
Chronic, unstable ?Body mass index is 48.42 kg/m?. ?Discussed importance of healthy weight management ?Discussed diet and exercise ? ?

## 2021-11-13 NOTE — Assessment & Plan Note (Signed)
Hx of ECHO 07/2020 with mild dilation of aortic root at 79m ?Has upcoming appt with cards early 11/2021 ?BP is now stable ?Continues to have gross LE edema s/s morbid obesity ?

## 2021-11-13 NOTE — Assessment & Plan Note (Signed)
Agreeable to cologuard for colon cancer screening ?Family hx of colon cancer in maternal grandmother  ?

## 2021-11-13 NOTE — Assessment & Plan Note (Signed)
Chronic, unstable ?Continues to take torsemide 20 mg ?Continue to recommend weight reduction, use of compression/elevation and sodium reduced diet ?

## 2021-11-13 NOTE — Assessment & Plan Note (Signed)
Hx of asymptomatic post menopausal; recommend screening for OP with use of DEXA ?Family hx of OP ?

## 2021-11-13 NOTE — Progress Notes (Signed)
?  ? ?I,Sulibeya S Dimas,acting as a scribe for Gwyneth Sprout, FNP.,have documented all relevant documentation on the behalf of Gwyneth Sprout, FNP,as directed by  Gwyneth Sprout, FNP while in the presence of Gwyneth Sprout, FNP. ? ? ?Established patient visit ? ? ?Patient: [name]   DOB: 1955/12/30   66 y.o. Female  MRN: [id number] ?Visit Date: 11/13/2021 ? ?Today's healthcare provider: Gwyneth Sprout, FNP  ?Re Introduced to nurse practitioner role and practice setting.  All questions answered.  Discussed provider/patient relationship and expectations. ? ? ?Chief Complaint  ?Patient presents with  ? Hypertension  ? ?Subjective  ?  ?HPI  ?Hypertension, follow-up ? ?BP Readings from Last 3 Encounters:  ?11/13/21 132/76  ?10/21/21 126/72  ?10/08/21 122/66  ? Wt Readings from Last 3 Encounters:  ?11/13/21 300 lb (136.1 kg)  ?10/21/21 297 lb 9.6 oz (135 kg)  ?10/08/21 295 lb (133.8 kg)  ?  ? ?She was last seen for hypertension 6 weeks ago.  ?BP at that visit was 168/83. Management since that visit includes referral to cardiology. Appt scheduled with cardio 11/27/21. ? ?She reports excellent compliance with treatment. ?She is not having side effects.  ?She does not smoke. ? ?Use of agents associated with hypertension: none.  ? ?Outside blood pressures are not being checked. ?Symptoms: ?No chest pain No chest pressure  ?Yes palpitations No syncope  ?Yes dyspnea Yes orthopnea  ?No paroxysmal nocturnal dyspnea Yes lower extremity edema  ? ?Pertinent labs ?Lab Results  ?Component Value Date  ? CHOL 188 03/24/2021  ? HDL 59 03/24/2021  ? LDLCALC 109 (H) 03/24/2021  ? TRIG 110 03/24/2021  ? CHOLHDL 3.2 03/24/2021  ? Lab Results  ?Component Value Date  ? NA 140 03/24/2021  ? K 5.0 03/24/2021  ? CREATININE 0.57 03/24/2021  ? EGFR 101 03/24/2021  ? GLUCOSE 82 03/24/2021  ? TSH 2.460 03/24/2021  ?  ? ?The 10-year ASCVD risk score (Arnett DK, et al., 2019) is:  7.4% ? ?--------------------------------------------------------------------------------------------------- ? ? ?Medications: ?Outpatient Medications Prior to Visit  ?Medication Sig  ? Azelastine HCl 0.15 % SOLN Place 1 spray into the nose in the morning and at bedtime.  ? budesonide (PULMICORT FLEXHALER) 180 MCG/ACT inhaler Inhale 1 puff into the lungs in the morning and at bedtime.  ? cetirizine (ZYRTEC) 10 MG tablet Take 1 tablet (10 mg total) by mouth daily.  ? [DISCONTINUED] olmesartan (BENICAR) 5 MG tablet Take 1 tablet (5 mg total) by mouth daily.  ? [DISCONTINUED] torsemide (DEMADEX) 20 MG tablet TAKE 1 TABLET(20 MG) BY MOUTH DAILY  ? [DISCONTINUED] albuterol (VENTOLIN HFA) 108 (90 Base) MCG/ACT inhaler Inhale 2 puffs into the lungs every 6 (six) hours as needed for wheezing or shortness of breath.  ? ?No facility-administered medications prior to visit.  ? ? ?Review of Systems  ?Respiratory:  Negative for chest tightness and shortness of breath.   ?Cardiovascular:  Positive for leg swelling. Negative for chest pain and palpitations.  ? ? ?  Objective  ?  ?BP 132/76 (BP Location: Left Arm, Patient Position: Sitting, Cuff Size: Large)   Pulse 92   Temp 99 ?F (37.2 ?C) (Oral)   Resp 16   Ht '5\' 6"'  (1.676 m)   Wt 300 lb (136.1 kg)   SpO2 96%   BMI 48.42 kg/m?  ? ? ?Physical Exam ?Vitals and nursing note reviewed.  ?Constitutional:   ?   General: She is not in acute distress. ?  Appearance: Normal appearance. She is obese. She is not ill-appearing, toxic-appearing or diaphoretic.  ?HENT:  ?   Head: Normocephalic and atraumatic.  ?Cardiovascular:  ?   Rate and Rhythm: Normal rate and regular rhythm.  ?   Pulses: Normal pulses.  ?   Heart sounds: Murmur heard.  ?Systolic murmur is present with a grade of 2/6.  ?  No friction rub. No gallop.  ?Pulmonary:  ?   Effort: Pulmonary effort is normal. No respiratory distress.  ?   Breath sounds: Normal breath sounds. No stridor. No wheezing, rhonchi or rales.   ?Chest:  ?   Chest wall: No tenderness.  ?Abdominal:  ?   General: Bowel sounds are normal.  ?   Palpations: Abdomen is soft.  ?Musculoskeletal:     ?   General: No swelling, tenderness, deformity or signs of injury. Normal range of motion.  ?   Right lower leg: 2+ Edema present.  ?   Left lower leg: 3+ Edema present.  ?Skin: ?   General: Skin is warm and dry.  ?   Capillary Refill: Capillary refill takes less than 2 seconds.  ?   Coloration: Skin is not jaundiced or pale.  ?   Findings: No bruising, erythema, lesion or rash.  ?Neurological:  ?   General: No focal deficit present.  ?   Mental Status: She is alert and oriented to person, place, and time. Mental status is at baseline.  ?   Cranial Nerves: No cranial nerve deficit.  ?   Sensory: No sensory deficit.  ?   Motor: No weakness.  ?   Coordination: Coordination normal.  ?Psychiatric:     ?   Mood and Affect: Mood normal.     ?   Behavior: Behavior normal.     ?   Thought Content: Thought content normal.     ?   Judgment: Judgment normal.  ?  ? ?No results found for any visits on 11/13/21. ? Assessment & Plan  ?  ? ?Problem List Items Addressed This Visit   ? ?  ? Cardiovascular and Mediastinum  ? Essential hypertension - Primary  ?  Chronic, stable ?Denies CP ?Denies SOB/ DOE ?Denies low blood pressure/hypotension ?Denies vision changes ?No LE Edema noted on exam ?Continue medication, Benicar 5 mg; Torsemide 62m  ?Denies side effects ?RTC 4 months  ?Seek emergent care if you develop chest pain or chest pressure ? ?  ?  ? Relevant Medications  ? torsemide (DEMADEX) 20 MG tablet  ? olmesartan (BENICAR) 5 MG tablet  ? Tortuous aorta (HCC)  ?  Hx of ECHO 07/2020 with mild dilation of aortic root at 465m?Has upcoming appt with cards early 11/2021 ?BP is now stable ?Continues to have gross LE edema s/s morbid obesity ? ?  ?  ? Relevant Medications  ? torsemide (DEMADEX) 20 MG tablet  ? olmesartan (BENICAR) 5 MG tablet  ?  ? Other  ? Colon cancer screening  ?   Agreeable to cologuard for colon cancer screening ?Family hx of colon cancer in maternal grandmother  ? ?  ?  ? Relevant Orders  ? Cologuard  ? Encounter for osteoporosis screening in asymptomatic postmenopausal patient  ?  Hx of asymptomatic post menopausal; recommend screening for OP with use of DEXA ?Family hx of OP ? ?  ?  ? Relevant Orders  ? DG Bone Density  ? Morbid obesity ([place] ?  Chronic, unstable ?Body mass index is  48.42 kg/m?. ?Discussed importance of healthy weight management ?Discussed diet and exercise ? ?  ?  ? Peripheral edema  ?  Chronic, unstable ?Continues to take torsemide 20 mg ?Continue to recommend weight reduction, use of compression/elevation and sodium reduced diet ? ?  ?  ? Relevant Medications  ? torsemide (DEMADEX) 20 MG tablet  ? ? ? ?Return in about 4 months (around 03/15/2022) for annual examination.  ?   ? ?I, Gwyneth Sprout, FNP, have reviewed all documentation for this visit. The documentation on 11/13/21 for the exam, diagnosis, procedures, and orders are all accurate and complete. ? ? ? ?Gwyneth Sprout, FNP  ?[place] ?[phone] (phone) ?[phone] (fax) ? ?[facility] ?

## 2021-11-18 ENCOUNTER — Ambulatory Visit: Payer: Self-pay

## 2021-11-18 NOTE — Telephone Encounter (Signed)
Pt requesting Scopolamine patch for cruise 01/28/22. States she will use OTC Dramamine as well, if needed. Would like to have patch available.If appropriate, [facility], [facility] [employer]. ? ? ? Reason for Disposition ? Prescription request for new medicine (not a refill) ? ?Answer Assessment - Initial Assessment Questions ?1. NAME of MEDICATION: "What medicine are you calling about?" ?    Requesting scopalamine patch ? ?Protocols used: Medication Question Call-A-AH ? ?

## 2021-11-18 NOTE — Telephone Encounter (Signed)
Summary: advice - motion sickness  ? Pt called in requesting motion sickness tablets or a patch for a trip she will be going on, pt needed advice since she did not have the name of the pills she had before for motion sickness, please advise.   ?  ?Attempted to reach patient. LM on Vm to call back. ?

## 2021-11-18 NOTE — Telephone Encounter (Signed)
Second attempt- LM on VM to call back to office. ?

## 2021-11-19 ENCOUNTER — Other Ambulatory Visit: Payer: Self-pay | Admitting: Family Medicine

## 2021-11-19 MED ORDER — SCOPOLAMINE 1 MG/3DAYS TD PT72: 1.0000 | MEDICATED_PATCH | TRANSDERMAL | 12 refills | Status: AC

## 2021-11-19 NOTE — Telephone Encounter (Signed)
Please advise 

## 2021-11-27 ENCOUNTER — Ambulatory Visit (INDEPENDENT_AMBULATORY_CARE_PROVIDER_SITE_OTHER): Payer: 59 | Admitting: Cardiology

## 2021-11-27 ENCOUNTER — Encounter: Payer: Self-pay | Admitting: Cardiology

## 2021-11-27 VITALS — BP 122/80 | HR 102 | Ht 66.0 in | Wt 299.0 lb

## 2021-11-27 DIAGNOSIS — R600 Localized edema: Secondary | ICD-10-CM

## 2021-11-27 DIAGNOSIS — R0609 Other forms of dyspnea: Secondary | ICD-10-CM

## 2021-11-27 DIAGNOSIS — I10 Essential (primary) hypertension: Secondary | ICD-10-CM

## 2021-11-27 DIAGNOSIS — R609 Edema, unspecified: Secondary | ICD-10-CM

## 2021-11-27 MED ORDER — TORSEMIDE 40 MG PO TABS: 40.0000 mg | ORAL_TABLET | Freq: Every day | ORAL | 3 refills | Status: DC

## 2021-11-27 NOTE — Progress Notes (Signed)
?Cardiology Office Note:   ? ?Date:  11/27/2021  ? ?ID:  [name], DOB 1956-07-12, MRN [id number] ? ?PCP:  Gwyneth Sprout, FNP ?  ?[name] ?Cardiologist:  None    ? ?Referring MD: Gwyneth Sprout, FNP  ? ?Chief Complaint  ?Patient presents with  ? NEw patient  ?  Referred by PCP for SOB, HTN, and Edema. Meds reviewed verbally with patient.   ? ?[name] is a 67 y.o. female who is being seen today for the evaluation of shortness of breath at the request of Gwyneth Sprout, FNP. ? ? ?History of Present Illness:   ? ?[name] is a 66 y.o. female with a hx of hypertension, morbid obesity who presents due to shortness of breath. ? ?Patient presents with worsening shortness of breath for over a year.  Symptoms of shortness of breath typically occur with minimal exertion.  She has also noticed leg edema.  Takes torsemide 20 mg daily, urinates for about 3 hours and then frequency of urination typically decreases.  Torsemide intake usually limited by high work as she requires to go to the bathroom frequently.  She denies chest pain.  Denies any history of heart disease. ? ?Previous echocardiogram 07/2020 showed preserved EF. ? ?Past Medical History:  ?Diagnosis Date  ? Chronic sinusitis   ? Hypertension   ? Migraines   ? Morbid obesity ([place])   ? Sensorineural hearing loss   ? ? ?Past Surgical History:  ?Procedure Laterality Date  ? ABDOMINAL HYSTERECTOMY  2008  ? Still has cervix  ? APPENDECTOMY  1967  ? BACK SURGERY    ? Ruptured disc.  ? [place]  ? Disc placed in back  ? TONSILLECTOMY  1960  ? ? ?Current Medications: ?Current Meds  ?Medication Sig  ? Azelastine HCl 0.15 % SOLN Place 1 spray into the nose in the morning and at bedtime.  ? budesonide (PULMICORT FLEXHALER) 180 MCG/ACT inhaler Inhale 1 puff into the lungs in the morning and at bedtime.  ? cetirizine (ZYRTEC) 10 MG tablet Take 1 tablet (10 mg total) by mouth daily.  ? olmesartan (BENICAR) 5 MG tablet Take 1 tablet  (5 mg total) by mouth daily.  ? scopolamine (TRANSDERM-SCOP) 1 MG/3DAYS Place 1 patch (1.5 mg total) onto the skin every 3 (three) days.  ? [DISCONTINUED] torsemide (DEMADEX) 20 MG tablet TAKE 1 TABLET(20 MG) BY MOUTH DAILY  ?  ? ?Allergies:   Augmentin [amoxicillin-pot clavulanate] and Azithromycin  ? ?Social History  ? ?Socioeconomic History  ? Marital status: Widowed  ?  Spouse name: Not on file  ? Number of children: 4  ? Years of education: H/S  ? Highest education level: Not on file  ?Occupational History  ?  Comment: Full-time 50-60 hours weekly  ?Tobacco Use  ? Smoking status: Never  ? Smokeless tobacco: Never  ?Substance and Sexual Activity  ? Alcohol use: No  ? Drug use: No  ? Sexual activity: Not on file  ?Other Topics Concern  ? Not on file  ?Social History Narrative  ? Not on file  ? ?Social Determinants of Health  ? ?Financial Resource Strain: Not on file  ?Food Insecurity: Not on file  ?Transportation Needs: Not on file  ?Physical Activity: Not on file  ?Stress: Not on file  ?Social Connections: Not on file  ?  ? ?Family History: ?The patient's family history includes Arthritis in her brother; Bipolar disorder in her mother;  Colon cancer in her maternal grandmother; Depression in her mother; Edema in her sister; Emphysema in her mother; Heart attack in her father and paternal grandfather; Hypertension in her brother and sister; Ulcers in her mother. There is no history of Breast cancer. ? ?ROS:   ?Please see the history of present illness.    ? All other systems reviewed and are negative. ? ?EKGs/Labs/Other Studies Reviewed:   ? ?The following studies were reviewed today: ? ? ?EKG:  EKG is  ordered today.  The ekg ordered today demonstrates sinus tachycardia, heart rate 102 ? ?Recent Labs: ?03/24/2021: ALT 24; BUN 12; Creatinine, Ser 0.57; Hemoglobin 13.3; Platelets 278; Potassium 5.0; Sodium 140; TSH 2.460  ?Recent Lipid Panel ?   ?Component Value Date/Time  ? CHOL 188 03/24/2021 1510  ? TRIG 110  03/24/2021 1510  ? HDL 59 03/24/2021 1510  ? CHOLHDL 3.2 03/24/2021 1510  ? LDLCALC 109 (H) 03/24/2021 1510  ? ? ? ?Risk Assessment/Calculations:   ? ?    ? ?Physical Exam:   ? ?VS:  BP 122/80 (BP Location: Left Arm, Patient Position: Sitting, Cuff Size: Large)   Pulse (!) 102   Ht '5\' 6"'$  (1.676 m)   Wt 299 lb (135.6 kg)   SpO2 95%   BMI 48.26 kg/m?    ? ?Wt Readings from Last 3 Encounters:  ?11/27/21 299 lb (135.6 kg)  ?11/13/21 300 lb (136.1 kg)  ?10/21/21 297 lb 9.6 oz (135 kg)  ?  ? ?GEN:  Well nourished, well developed in no acute distress ?HEENT: Normal ?NECK: No JVD; No carotid bruits ?CARDIAC: RRR, no murmurs, rubs, gallops ?RESPIRATORY:  Clear to auscultation without rales, wheezing or rhonchi  ?ABDOMEN: Soft, non-tender, non-distended ?MUSCULOSKELETAL:  2+ edema; No deformity  ?SKIN: Warm and dry ?NEUROLOGIC:  Alert and oriented x 3 ?PSYCHIATRIC:  Normal affect  ? ?ASSESSMENT:   ? ?1. Dyspnea on exertion   ?2. Leg edema   ?3. Primary hypertension   ?4. Morbid obesity ([place])   ?5. Peripheral edema   ? ?PLAN:   ? ?In order of problems listed above: ? ?Dyspnea on exertion, likely from morbid obesity versus HFpEF.  Obtain echocardiogram, increase torsemide to 40 mg daily. ?Leg edema, torsemide as above.  Check BMP in 1 week. ?Hypertension, BP controlled.  Continue Benicar. ?Morbid obesity, low-calorie diet advised, plan to start Wegovy at follow-up visit after echo. ? ?Follow-up in 4 to 6 weeks ? ?   ? ? ?Medication Adjustments/Labs and Tests Ordered: ?Current medicines are reviewed at length with the patient today.  Concerns regarding medicines are outlined above.  ?Orders Placed This Encounter  ?Procedures  ? Basic metabolic panel  ? EKG 12-Lead  ? ECHOCARDIOGRAM COMPLETE  ? ?Meds ordered this encounter  ?Medications  ? torsemide 40 MG TABS  ?  Sig: Take 40 mg by mouth daily. TAKE 1 TABLET(20 MG) BY MOUTH DAILY  ?  Dispense:  30 tablet  ?  Refill:  3  ? ? ?Patient Instructions  ?Medication Instructions:   ? ?Your physician has recommended you make the following change in your medication:  ? ? INCREASE your Torsemide to 40 MG once a day. ? ? ?*If you need a refill on your cardiac medications before your next appointment, please call your pharmacy* ? ? ?Lab Work: ? ?Your physician recommends that you return for lab work (BMP) in: [place] ? ?- Please go to the [facility]. You will check in at the front desk to  the right as you walk into the atrium. Valet Parking is offered if needed. ?- No appointment needed. You may go any day between 7 am and 6 pm.  ? ? ? ?Testing/Procedures: ? ?Your physician has requested that you have an echocardiogram. Echocardiography is a painless test that uses sound waves to create images of your heart. It provides your doctor with information about the size and shape of your heart and how well your heart?s chambers and valves are working. This procedure takes approximately one hour. There are no restrictions for this procedure. ? ? ? ?Follow-Up: ?At [facility], you and your health needs are our priority.  As part of our continuing mission to provide you with exceptional heart care, we have created designated Provider Care Teams.  These Care Teams include your primary Cardiologist (physician) and Advanced [facility] (APPs -  Physician Assistants and Nurse Practitioners) who all work together to provide you with the care you need, when you need it. ? ?We recommend signing up for the patient portal called "MyChart".  Sign up information is provided on this After Visit Summary.  MyChart is used to connect with patients for Virtual Visits (Telemedicine).  Patients are able to view lab/test results, encounter notes, upcoming appointments, etc.  Non-urgent messages can be sent to your provider as well.   ?To learn more about what you can do with MyChart, go to [email or web address].   ? ?Your next appointment:   ?Follow up 6 weeks  ? ?The format for your next appointment:   ?In  Person ? ?Provider:   ? ?ONLY WITH ?Kate Sable, MD  ? ? ?Other Instructions ? ? ?Important Information About Sugar ? ? ? ? ? ?  ? ?Signed, ?Kate Sable, MD  ?11/27/2021 4:24 PM    ?[place]

## 2021-11-27 NOTE — Patient Instructions (Signed)
Medication Instructions:  ? ?Your physician has recommended you make the following change in your medication:  ? ? INCREASE your Torsemide to 40 MG once a day. ? ? ?*If you need a refill on your cardiac medications before your next appointment, please call your pharmacy* ? ? ?Lab Work: ? ?Your physician recommends that you return for lab work (BMP) in: [place] ? ?- Please go to the [facility]. You will check in at the front desk to the right as you walk into the atrium. Valet Parking is offered if needed. ?- No appointment needed. You may go any day between 7 am and 6 pm.  ? ? ? ?Testing/Procedures: ? ?Your physician has requested that you have an echocardiogram. Echocardiography is a painless test that uses sound waves to create images of your heart. It provides your doctor with information about the size and shape of your heart and how well your heart?s chambers and valves are working. This procedure takes approximately one hour. There are no restrictions for this procedure. ? ? ? ?Follow-Up: ?At [facility], you and your health needs are our priority.  As part of our continuing mission to provide you with exceptional heart care, we have created designated Provider Care Teams.  These Care Teams include your primary Cardiologist (physician) and Advanced Practice Providers (APPs -  Physician Assistants and Nurse Practitioners) who all work together to provide you with the care you need, when you need it. ? ?We recommend signing up for the patient portal called "MyChart".  Sign up information is provided on this After Visit Summary.  MyChart is used to connect with patients for Virtual Visits (Telemedicine).  Patients are able to view lab/test results, encounter notes, upcoming appointments, etc.  Non-urgent messages can be sent to your provider as well.   ?To learn more about what you can do with MyChart, go to [email or web address].   ? ?Your next appointment:   ?Follow up 6 weeks  ? ?The format for your  next appointment:   ?In Person ? ?Provider:   ? ?ONLY WITH ?Kate Sable, MD  ? ? ?Other Instructions ? ? ?Important Information About Sugar ? ? ? ? ? ? ?

## 2021-12-04 ENCOUNTER — Other Ambulatory Visit
Admission: RE | Admit: 2021-12-04 | Discharge: 2021-12-04 | Disposition: A | Discharge status: Home / Self Care | Payer: 59 | Attending: Cardiology | Admitting: Cardiology

## 2021-12-04 DIAGNOSIS — R600 Localized edema: Secondary | ICD-10-CM | POA: Insufficient documentation

## 2021-12-04 DIAGNOSIS — R0609 Other forms of dyspnea: Secondary | ICD-10-CM | POA: Insufficient documentation

## 2021-12-04 LAB — BASIC METABOLIC PANEL
Anion gap: 11 (ref 5–15)
BUN: 18 mg/dL (ref 8–23)
CO2: 24 mmol/L (ref 22–32)
Calcium: 10.1 mg/dL (ref 8.9–10.3)
Chloride: 105 mmol/L (ref 98–111)
Creatinine, Ser: 0.74 mg/dL (ref 0.44–1.00)
GFR, Estimated: >60 mL/min (ref >60)
Glucose, Bld: 110 mg/dL — ABNORMAL HIGH (ref 70–99)
Potassium: 3.7 mmol/L (ref 3.5–5.1)
Sodium: 140 mmol/L (ref 135–145)

## 2021-12-15 LAB — COLOGUARD: COLOGUARD: NEGATIVE

## 2021-12-25 ENCOUNTER — Ambulatory Visit (INDEPENDENT_AMBULATORY_CARE_PROVIDER_SITE_OTHER): Payer: 59

## 2021-12-25 DIAGNOSIS — R0609 Other forms of dyspnea: Secondary | ICD-10-CM

## 2021-12-25 DIAGNOSIS — R600 Localized edema: Secondary | ICD-10-CM

## 2021-12-25 LAB — ECHOCARDIOGRAM COMPLETE
AR max vel: 3.03 cm2
AV Area VTI: 3.1 cm2
AV Area mean vel: 2.86 cm2
AV Mean grad: 3 mmHg
AV Peak grad: 6.1 mmHg
Ao pk vel: 1.24 m/s
Area-P 1/2: 3.56 cm2

## 2021-12-25 MED ORDER — PERFLUTREN LIPID MICROSPHERE
1.0000 mL | INTRAVENOUS | Status: AC | PRN
  Administered 2021-12-25: 2 mL via INTRAVENOUS

## 2021-12-28 ENCOUNTER — Other Ambulatory Visit: Payer: Self-pay | Admitting: Pulmonary Disease

## 2021-12-28 DIAGNOSIS — I10 Essential (primary) hypertension: Secondary | ICD-10-CM

## 2022-01-15 ENCOUNTER — Ambulatory Visit (INDEPENDENT_AMBULATORY_CARE_PROVIDER_SITE_OTHER): Payer: 59 | Admitting: Cardiology

## 2022-01-15 ENCOUNTER — Encounter: Payer: Self-pay | Admitting: Cardiology

## 2022-01-15 VITALS — BP 140/86 | HR 97 | Ht 66.0 in | Wt 300.0 lb

## 2022-01-15 DIAGNOSIS — I10 Essential (primary) hypertension: Secondary | ICD-10-CM | POA: Diagnosis not present

## 2022-01-15 DIAGNOSIS — R0609 Other forms of dyspnea: Secondary | ICD-10-CM | POA: Diagnosis not present

## 2022-01-15 DIAGNOSIS — R600 Localized edema: Secondary | ICD-10-CM | POA: Diagnosis not present

## 2022-01-15 MED ORDER — SEMAGLUTIDE-WEIGHT MANAGEMENT 0.25 MG/0.5ML ~~LOC~~ SOAJ: 0.2500 mg | SUBCUTANEOUS | 0 refills | Status: DC

## 2022-03-17 NOTE — Progress Notes (Deleted)
Complete physical exam   Patient: [name]   DOB: June 21, 1956   66 y.o. Female  MRN: [id number] Visit Date: 03/26/2022  Today's healthcare provider: Gwyneth Sprout, FNP   No chief complaint on file.  Subjective    [name] is a 66 y.o. female who presents today for a complete physical exam.  She reports consuming a {diet types:17450} diet. {Exercise:19826} She generally feels {well/fairly well/poorly:18703}. She reports sleeping {well/fairly well/poorly:18703}. She {does/does not:200015} have additional problems to discuss today.  HPI  ***  Past Medical History:  Diagnosis Date   Chronic sinusitis    Hypertension    Migraines    Morbid obesity ([place])    Sensorineural hearing loss    Past Surgical History:  Procedure Laterality Date   ABDOMINAL HYSTERECTOMY  October 31, 2006   Still has cervix   APPENDECTOMY  1967   BACK SURGERY     Ruptured disc.   BACK SURGERY  1985   Disc placed in back   TONSILLECTOMY  1960   Social History   Socioeconomic History   Marital status: Widowed    Spouse name: Not on file   Number of children: 4   Years of education: H/S   Highest education level: Not on file  Occupational History    Comment: Full-time 50-60 hours weekly  Tobacco Use   Smoking status: Never   Smokeless tobacco: Never  Vaping Use   Vaping Use: Never used  Substance and Sexual Activity   Alcohol use: No   Drug use: No   Sexual activity: Not on file  Other Topics Concern   Not on file  Social History Narrative   Not on file   Social Determinants of Health   Financial Resource Strain: Not on file  Food Insecurity: Not on file  Transportation Needs: Not on file  Physical Activity: Not on file  Stress: Not on file  Social Connections: Not on file  Intimate Partner Violence: Not on file   Family Status  Relation Name Status   Mother  Deceased   Father  Deceased at age 67       from a heart attack   Sister  Alive   MGM  Deceased at age 68        Died from Colon Cancer   PGF  Deceased at age 93's       Heart Attack   Brother  Deceased       Died from [place] in 1971-10-31   Brother  Alive   Neg Hx  (Not Specified)   Family History  Problem Relation Age of Onset   Bipolar disorder Mother    Emphysema Mother    Depression Mother    Ulcers Mother    Heart attack Father    Hypertension Sister    Edema Sister    Colon cancer Maternal Grandmother    Heart attack Paternal Grandfather    Hypertension Brother    Arthritis Brother    Breast cancer Neg Hx    Allergies  Allergen Reactions   Augmentin [Amoxicillin-Pot Clavulanate] Diarrhea   Azithromycin Diarrhea    Patient Care Team: Gwyneth Sprout, FNP as PCP - General (Family Medicine)   Medications: Outpatient Medications Prior to Visit  Medication Sig   Azelastine HCl 0.15 % SOLN Place 1 spray into the nose in the morning and at bedtime.   budesonide (PULMICORT FLEXHALER) 180 MCG/ACT inhaler Inhale 1 puff into the lungs in the morning and  at bedtime.   cetirizine (ZYRTEC) 10 MG tablet Take 1 tablet (10 mg total) by mouth daily.   olmesartan (BENICAR) 5 MG tablet Take 1 tablet (5 mg total) by mouth daily.   scopolamine (TRANSDERM-SCOP) 1 MG/3DAYS Place 1 patch (1.5 mg total) onto the skin every 3 (three) days.   Semaglutide-Weight Management 0.25 MG/0.5ML SOAJ Inject 0.25 mg into the skin once a week for 28 days.   torsemide (DEMADEX) 20 MG tablet Take 20 mg by mouth 2 (two) times daily.   No facility-administered medications prior to visit.    Review of Systems  {Labs  Heme  Chem  Endocrine  Serology  Results Review (optional):23779}  Objective    There were no vitals taken for this visit. {Show previous vital signs (optional):23777}   Physical Exam  ***  Last depression screening scores    10/02/2021    1:15 PM 03/24/2021    1:34 PM 02/16/2021    5:02 PM  PHQ 2/9 Scores  PHQ - 2 Score 0 0 0  PHQ- 9 Score 0 0 0   Last fall risk screening    10/02/2021     1:14 PM  [place] in the past year? 0  Number falls in past yr: 0  Injury with Fall? 0  Risk for fall due to : No Fall Risks   Last Audit-C alcohol use screening    10/02/2021    1:14 PM  Alcohol Use Disorder Test (AUDIT)  1. How often do you have a drink containing alcohol? 0  2. How many drinks containing alcohol do you have on a typical day when you are drinking? 0  3. How often do you have six or more drinks on one occasion? 0  AUDIT-C Score 0   A score of 3 or more in women, and 4 or more in men indicates increased risk for alcohol abuse, EXCEPT if all of the points are from question 1   No results found for any visits on 03/26/22.  Assessment & Plan    Routine Health Maintenance and Physical Exam  Exercise Activities and Dietary recommendations  Goals   None     Immunization History  Administered Date(s) Administered   Influenza,inj,Quad PF,6+ Mos 05/12/2018   Influenza-Unspecified 05/15/2020, 04/27/2021   Moderna SARS-COV2 Booster Vaccination 07/14/2020   Moderna Sars-Covid-2 Vaccination 10/13/2019, 11/10/2019, 03/26/2021   Tdap 04/10/2009, 01/31/2020    Health Maintenance  Topic Date Due   PAP SMEAR-Modifier  Never done   Zoster Vaccines- Shingrix (1 of 2) Never done   COVID-19 Vaccine (4 - Moderna series) 05/21/2021   DEXA SCAN  Never done   COLONOSCOPY (Pts 45-52yr Insurance coverage will need to be confirmed)  09/29/2021   INFLUENZA VACCINE  02/23/2022   Pneumonia Vaccine 66 Years old (1 - PCV) 09/03/2022 (Originally 06/03/2021)   MAMMOGRAM  03/20/2023   TETANUS/TDAP  01/30/2030   Hepatitis C Screening  Completed   HIV Screening  Completed   HPV VACCINES  Aged Out    Discussed health benefits of physical activity, and encouraged her to engage in regular exercise appropriate for her age and condition.  ***  No follow-ups on file.     {provider attestation***:1}   EGwyneth Sprout [phone] (phone) [phone](fax)  [place]

## 2022-03-26 ENCOUNTER — Ambulatory Visit: Payer: 59 | Admitting: Family Medicine

## 2022-06-16 ENCOUNTER — Other Ambulatory Visit: Payer: Self-pay | Admitting: Family Medicine

## 2022-06-16 DIAGNOSIS — Z1231 Encounter for screening mammogram for malignant neoplasm of breast: Secondary | ICD-10-CM

## 2022-07-23 ENCOUNTER — Ambulatory Visit: Payer: Medicare Other | Admitting: Cardiology

## 2022-07-29 ENCOUNTER — Encounter: Payer: Medicare Other | Admitting: Family Medicine

## 2022-08-06 ENCOUNTER — Ambulatory Visit
Admission: RE | Admit: 2022-08-06 | Discharge: 2022-08-06 | Disposition: A | Discharge status: Home / Self Care | Payer: Medicare Other | Source: Ambulatory Visit | Attending: Family Medicine | Admitting: Family Medicine

## 2022-08-06 DIAGNOSIS — Z1231 Encounter for screening mammogram for malignant neoplasm of breast: Secondary | ICD-10-CM | POA: Diagnosis not present

## 2022-08-09 NOTE — Progress Notes (Signed)
Hi Jane  Normal mammogram; repeat in 1 year.  Please let us know if you have any questions.  Thank you,  Tally Joe, FNP

## 2022-08-31 ENCOUNTER — Other Ambulatory Visit: Payer: Self-pay | Admitting: Family Medicine

## 2022-08-31 DIAGNOSIS — I10 Essential (primary) hypertension: Secondary | ICD-10-CM

## 2022-09-08 ENCOUNTER — Encounter: Payer: Self-pay | Admitting: Family Medicine

## 2022-09-08 ENCOUNTER — Ambulatory Visit (INDEPENDENT_AMBULATORY_CARE_PROVIDER_SITE_OTHER): Payer: Medicare Other | Admitting: Family Medicine

## 2022-09-08 VITALS — BP 130/80 | HR 97 | Temp 98.3°F | Ht 66.0 in | Wt 296.0 lb

## 2022-09-08 DIAGNOSIS — E7800 Pure hypercholesterolemia, unspecified: Secondary | ICD-10-CM

## 2022-09-08 DIAGNOSIS — I771 Stricture of artery: Secondary | ICD-10-CM

## 2022-09-08 DIAGNOSIS — Z0000 Encounter for general adult medical examination without abnormal findings: Secondary | ICD-10-CM

## 2022-09-08 DIAGNOSIS — I10 Essential (primary) hypertension: Secondary | ICD-10-CM | POA: Diagnosis not present

## 2022-09-08 DIAGNOSIS — R600 Localized edema: Secondary | ICD-10-CM

## 2022-09-08 DIAGNOSIS — J683 Other acute and subacute respiratory conditions due to chemicals, gases, fumes and vapors: Secondary | ICD-10-CM

## 2022-09-08 DIAGNOSIS — R609 Edema, unspecified: Secondary | ICD-10-CM

## 2022-09-08 DIAGNOSIS — R739 Hyperglycemia, unspecified: Secondary | ICD-10-CM | POA: Insufficient documentation

## 2022-09-08 MED ORDER — TORSEMIDE 20 MG PO TABS: 20.0000 mg | ORAL_TABLET | Freq: Two times a day (BID) | ORAL | 3 refills | Status: DC

## 2022-09-08 NOTE — Progress Notes (Signed)
Established patient visit   Patient: [name]   DOB: 1956/07/09   67 y.o. Female  MRN: [id number] Visit Date: 09/08/2022  Today's healthcare provider: Gwyneth Sprout, FNP  Introduced to nurse practitioner role and practice setting.  All questions answered.  Discussed provider/patient relationship and expectations.  Subjective    HPI   Medications: Outpatient Medications Prior to Visit  Medication Sig   Azelastine HCl 0.15 % SOLN Place 1 spray into the nose in the morning and at bedtime.   budesonide (PULMICORT FLEXHALER) 180 MCG/ACT inhaler Inhale 1 puff into the lungs in the morning and at bedtime.   cetirizine (ZYRTEC) 10 MG tablet Take 1 tablet (10 mg total) by mouth daily.   olmesartan (BENICAR) 5 MG tablet TAKE 1 TABLET(5 MG) BY MOUTH DAILY   scopolamine (TRANSDERM-SCOP) 1 MG/3DAYS Place 1 patch (1.5 mg total) onto the skin every 3 (three) days.   [DISCONTINUED] torsemide (DEMADEX) 20 MG tablet Take 20 mg by mouth 2 (two) times daily.   [DISCONTINUED] Semaglutide-Weight Management 0.25 MG/0.5ML SOAJ Inject 0.25 mg into the skin once a week for 28 days.   No facility-administered medications prior to visit.    Review of Systems  Last CBC Lab Results  Component Value Date   WBC 9.1 03/24/2021   HGB 13.3 03/24/2021   HCT 40.6 03/24/2021   MCV 84 03/24/2021   MCH 27.7 03/24/2021   RDW 14.2 03/24/2021   PLT 278 [id number]   Last metabolic panel Lab Results  Component Value Date   GLUCOSE 110 (H) 12/04/2021   NA 140 12/04/2021   K 3.7 12/04/2021   CL 105 12/04/2021   CO2 24 12/04/2021   BUN 18 12/04/2021   CREATININE 0.74 12/04/2021   GFRNONAA >60 12/04/2021   CALCIUM 10.1 12/04/2021   PROT 6.6 03/24/2021   ALBUMIN 4.4 03/24/2021   LABGLOB 2.2 03/24/2021   AGRATIO 2.0 03/24/2021   BILITOT 0.3 03/24/2021   ALKPHOS 84 03/24/2021   AST 19 03/24/2021   ALT 24 03/24/2021   ANIONGAP 11 12/04/2021   Last lipids Lab Results  Component Value Date    CHOL 188 03/24/2021   HDL 59 03/24/2021   LDLCALC 109 (H) 03/24/2021   TRIG 110 03/24/2021   CHOLHDL 3.2 03/24/2021   Last thyroid functions Lab Results  Component Value Date   TSH 2.460 03/24/2021      Objective    BP 130/80 Comment: home reading 3 weeks ago  Pulse 97   Temp 98.3 F (36.8 C)   Ht 5' 6"$  (1.676 m)   Wt 296 lb (134.3 kg)   SpO2 98%   BMI 47.78 kg/m   BP Readings from Last 3 Encounters:  09/08/22 130/80  01/15/22 140/86  11/27/21 122/80   Wt Readings from Last 3 Encounters:  09/08/22 296 lb (134.3 kg)  01/15/22 300 lb (136.1 kg)  11/27/21 299 lb (135.6 kg)   SpO2 Readings from Last 3 Encounters:  09/08/22 98%  01/15/22 98%  11/27/21 95%   Physical Exam Vitals and nursing note reviewed.  Constitutional:      General: She is awake. She is not in acute distress.    Appearance: Normal appearance. She is well-developed and well-groomed. She is obese. She is not ill-appearing, toxic-appearing or diaphoretic.  HENT:     Head: Normocephalic and atraumatic.     Jaw: There is normal jaw occlusion. No trismus, tenderness, swelling or pain on movement.     Right  Ear: Hearing, tympanic membrane, ear canal and external ear normal. There is no impacted cerumen.     Left Ear: Hearing, tympanic membrane, ear canal and external ear normal. There is no impacted cerumen.     Nose: Nose normal. No congestion or rhinorrhea.     Right Turbinates: Not enlarged, swollen or pale.     Left Turbinates: Not enlarged, swollen or pale.     Right Sinus: No maxillary sinus tenderness or frontal sinus tenderness.     Left Sinus: No maxillary sinus tenderness or frontal sinus tenderness.     Mouth/Throat:     Lips: Pink.     Mouth: Mucous membranes are moist. No injury.     Tongue: No lesions.     Pharynx: Oropharynx is clear. Uvula midline. No pharyngeal swelling, oropharyngeal exudate, posterior oropharyngeal erythema or uvula swelling.     Tonsils: No tonsillar exudate or  tonsillar abscesses.  Eyes:     General: Lids are normal. Lids are everted, no foreign bodies appreciated. Vision grossly intact. Gaze aligned appropriately. No allergic shiner or visual field deficit.       Right eye: No discharge.        Left eye: No discharge.     Extraocular Movements: Extraocular movements intact.     Conjunctiva/sclera: Conjunctivae normal.     Right eye: Right conjunctiva is not injected. No exudate.    Left eye: Left conjunctiva is not injected. No exudate.    Pupils: Pupils are equal, round, and reactive to light.  Neck:     Thyroid: No thyroid mass, thyromegaly or thyroid tenderness.     Vascular: No carotid bruit.     Trachea: Trachea normal.  Cardiovascular:     Rate and Rhythm: Normal rate and regular rhythm.     Pulses: Normal pulses.          Carotid pulses are 2+ on the right side and 2+ on the left side.      Radial pulses are 2+ on the right side and 2+ on the left side.       Dorsalis pedis pulses are 2+ on the right side and 2+ on the left side.       Posterior tibial pulses are 2+ on the right side and 2+ on the left side.     Heart sounds: Normal heart sounds, S1 normal and S2 normal. No murmur heard.    No friction rub. No gallop.  Pulmonary:     Effort: Pulmonary effort is normal. No respiratory distress.     Breath sounds: Normal breath sounds and air entry. No stridor. No wheezing, rhonchi or rales.  Chest:     Chest wall: No tenderness.  Abdominal:     General: Abdomen is flat. Bowel sounds are normal. There is no distension.     Palpations: Abdomen is soft. There is no mass.     Tenderness: There is no abdominal tenderness. There is no right CVA tenderness, left CVA tenderness, guarding or rebound.     Hernia: No hernia is present.  Genitourinary:    Comments: Exam deferred; denies complaints Musculoskeletal:        General: No swelling, tenderness, deformity or signs of injury. Normal range of motion.     Cervical back: Full passive  range of motion without pain, normal range of motion and neck supple. No edema, rigidity or tenderness. No muscular tenderness.     Right lower leg: No edema.     Left lower  leg: No edema.  Lymphadenopathy:     Cervical: No cervical adenopathy.     Right cervical: No superficial, deep or posterior cervical adenopathy.    Left cervical: No superficial, deep or posterior cervical adenopathy.  Skin:    General: Skin is warm and dry.     Capillary Refill: Capillary refill takes less than 2 seconds.     Coloration: Skin is not jaundiced or pale.     Findings: No bruising, erythema, lesion or rash.  Neurological:     General: No focal deficit present.     Mental Status: She is alert and oriented to person, place, and time. Mental status is at baseline.     GCS: GCS eye subscore is 4. GCS verbal subscore is 5. GCS motor subscore is 6.     Sensory: Sensation is intact. No sensory deficit.     Motor: Motor function is intact. No weakness.     Coordination: Coordination is intact. Coordination normal.     Gait: Gait is intact. Gait normal.  Psychiatric:        Attention and Perception: Attention and perception normal.        Mood and Affect: Mood and affect normal.        Speech: Speech normal.        Behavior: Behavior normal. Behavior is cooperative.        Thought Content: Thought content normal.        Cognition and Memory: Cognition and memory normal.        Judgment: Judgment normal.     No results found for any visits on 09/08/22.  Assessment & Plan     Problem List Items Addressed This Visit       Cardiovascular and Mediastinum   Essential hypertension    Chronic, borderline in office Stable on home numbers- however, not checked in 3 weeks Continue Benicar 5 mg with goal of <140/<90 with addition of torsemide 20 mg BID for fluid assistance Continue to recommend low salt, heart healthy diet; routine exercise- building up to 150 mins of purposeful exercise per week and weight  reduction of 5-10% to assist.      Relevant Medications   torsemide (DEMADEX) 20 MG tablet   Other Relevant Orders   CBC with Differential/Platelet   Comprehensive Metabolic Panel (CMET)   TSH + free T4   Tortuous aorta (HCC)    ECHO 07/2020 with mild dilation of aortic root at 56m Morbid obesity relatively unchanged Edema improved with change to BID dosing HTN borderline- goal of <140/<90 Encouraged to take medication daily for further prevention of co morbidities       Relevant Medications   torsemide (DEMADEX) 20 MG tablet   Other Relevant Orders   Lipid panel     Respiratory   Reactive airways dysfunction syndrome (HCC)    Chronic, variable Endorses dry throat/mouth with exercise; has not discussed with pulmonary. Encouraged to increase water to assist.  Does not use the Pulmicort daily; encouraged Continues to use the other medications PRN as well -azelastine, zyrtec Previously followed by cardiology and pulmonary with preserved EF      Relevant Orders   CBC with Differential/Platelet   Comprehensive Metabolic Panel (CMET)     Other   Annual physical exam    Things to do to keep yourself healthy  - Exercise at least 30-45 minutes a day, 3-4 days a week.  - Eat a low-fat diet with lots of fruits and vegetables, up to  7-9 servings per day.  - Seatbelts can save your life. Wear them always.  - Smoke detectors on every level of your home, check batteries every year.  - Eye Doctor - have an eye exam every 1-2 years  - Safe sex - if you may be exposed to STDs, use a condom.  - Alcohol -  If you drink, do it moderately, less than 2 drinks per day.  - [place]. Choose someone to speak for you if you are not able.  - Depression is common in our stressful world.If you're feeling down or losing interest in things you normally enjoy, please come in for a visit.  - Violence - If anyone is threatening or hurting you, please call immediately.        Elevated LDL cholesterol level    Chronic, deferred statin previously Repeat LP Goal LDL <100      Relevant Orders   Lipid panel   Elevated serum glucose    Chronic, check A1c given risk for metabolic syndrome Continue to recommend balanced, lower carb meals. Smaller meal size, adding snacks. Choosing water as drink of choice and increasing purposeful exercise.       Relevant Orders   Comprehensive Metabolic Panel (CMET)   Hemoglobin A1c   Medicare annual wellness visit, initial - Primary    Due for  -dental -vision -DEXA -immunizations (Shingrix, Covid, PNA) -no longer needs PAP -mammo done 1/24; breast exam completed today and encouraged at home -cologuard d/u 11/2024      Morbid obesity ([place])    Chronic, unchanged Was placed on ozempic to assist and pt did not continue treatment Discussed importance of healthy weight management Discussed diet and exercise       Relevant Orders   CBC with Differential/Platelet   Comprehensive Metabolic Panel (CMET)   Hemoglobin A1c   TSH + free T4   Lipid panel   Peripheral edema    Chronic, improved Continue torsemide as previously prescribed by cardiology      Relevant Orders   CBC with Differential/Platelet   Comprehensive Metabolic Panel (CMET)   Return in about 1 year (around 09/12/2023) for annual examination.     Vonna Kotyk, FNP, have reviewed all documentation for this visit. The documentation on 09/08/22 for the exam, diagnosis, procedures, and orders are all accurate and complete.  Gwyneth Sprout, [place] [phone] (phone) [phone] (fax)  [place]

## 2022-09-08 NOTE — Assessment & Plan Note (Signed)
Chronic, improved Continue torsemide as previously prescribed by cardiology

## 2022-09-08 NOTE — Assessment & Plan Note (Signed)
Chronic, check A1c given risk for metabolic syndrome Continue to recommend balanced, lower carb meals. Smaller meal size, adding snacks. Choosing water as drink of choice and increasing purposeful exercise.

## 2022-09-08 NOTE — Assessment & Plan Note (Addendum)
Chronic, variable Endorses dry throat/mouth with exercise; has not discussed with pulmonary. Encouraged to increase water to assist.  Does not use the Pulmicort daily; encouraged Continues to use the other medications PRN as well -azelastine, zyrtec Previously followed by cardiology and pulmonary with preserved EF

## 2022-09-08 NOTE — Assessment & Plan Note (Signed)
Due for  -dental -vision -DEXA -immunizations (Shingrix, Covid, PNA) -no longer needs PAP -mammo done 1/24; breast exam completed today and encouraged at home -cologuard d/u 11/2024

## 2022-09-08 NOTE — Assessment & Plan Note (Signed)
ECHO 07/2020 with mild dilation of aortic root at 46m Morbid obesity relatively unchanged Edema improved with change to BID dosing HTN borderline- goal of <140/<90 Encouraged to take medication daily for further prevention of co morbidities

## 2022-09-08 NOTE — Assessment & Plan Note (Signed)
Chronic, borderline in office Stable on home numbers- however, not checked in 3 weeks Continue Benicar 5 mg with goal of <140/<90 with addition of torsemide 20 mg BID for fluid assistance Continue to recommend low salt, heart healthy diet; routine exercise- building up to 150 mins of purposeful exercise per week and weight reduction of 5-10% to assist.

## 2022-09-08 NOTE — Assessment & Plan Note (Signed)
Chronic, unchanged Was placed on ozempic to assist and pt did not continue treatment Discussed importance of healthy weight management Discussed diet and exercise

## 2022-09-08 NOTE — Assessment & Plan Note (Signed)

## 2022-09-08 NOTE — Patient Instructions (Addendum)
The CDC recommends two doses of Shingrix (the new shingles vaccine) separated by 2 to 6 months for adults age 68 years and older. I recommend checking with your insurance plan regarding coverage for this vaccine.   *Pneumonia  *Please call and schedule eye appt *Please call and schedule dental appt *Please call for DEXA  Please call and schedule your DEXA:  [address], [place],  [place]  [id number] Get Driving Directions Main: [phone]

## 2022-09-08 NOTE — Assessment & Plan Note (Signed)
Chronic, deferred statin previously Repeat LP Goal LDL <100

## 2022-09-09 LAB — COMPREHENSIVE METABOLIC PANEL
ALT: 35 IU/L — ABNORMAL HIGH (ref 0–32)
AST: 24 IU/L (ref 0–40)
Albumin/Globulin Ratio: 1.7 (ref 1.2–2.2)
Albumin: 4.5 g/dL (ref 3.9–4.9)
Alkaline Phosphatase: 89 IU/L (ref 44–121)
BUN/Creatinine Ratio: 22 (ref 12–28)
BUN: 17 mg/dL (ref 8–27)
Bilirubin Total: 0.3 mg/dL (ref 0.0–1.2)
CO2: 23 mmol/L (ref 20–29)
Calcium: 10.1 mg/dL (ref 8.7–10.3)
Chloride: 103 mmol/L (ref 96–106)
Creatinine, Ser: 0.76 mg/dL (ref 0.57–1.00)
Globulin, Total: 2.6 g/dL (ref 1.5–4.5)
Glucose: 98 mg/dL (ref 70–99)
Potassium: 5.1 mmol/L (ref 3.5–5.2)
Sodium: 141 mmol/L (ref 134–144)
Total Protein: 7.1 g/dL (ref 6.0–8.5)
eGFR: 86 mL/min/{1.73_m2} (ref >59)

## 2022-09-09 LAB — CBC WITH DIFFERENTIAL/PLATELET
Basophils Absolute: 0.1 10*3/uL (ref 0.0–0.2)
Basos: 1 %
EOS (ABSOLUTE): 0.2 10*3/uL (ref 0.0–0.4)
Eos: 2 %
Hematocrit: 43.9 % (ref 34.0–46.6)
Hemoglobin: 14.3 g/dL (ref 11.1–15.9)
Immature Grans (Abs): 0 10*3/uL (ref 0.0–0.1)
Immature Granulocytes: 0 %
Lymphocytes Absolute: 2.4 10*3/uL (ref 0.7–3.1)
Lymphs: 27 %
MCH: 27.1 pg (ref 26.6–33.0)
MCHC: 32.6 g/dL (ref 31.5–35.7)
MCV: 83 fL (ref 79–97)
Monocytes Absolute: 1.1 10*3/uL — ABNORMAL HIGH (ref 0.1–0.9)
Monocytes: 12 %
Neutrophils Absolute: 5.1 10*3/uL (ref 1.4–7.0)
Neutrophils: 58 %
Platelets: 296 10*3/uL (ref 150–450)
RBC: 5.28 x10E6/uL (ref 3.77–5.28)
RDW: 14.6 % (ref 11.7–15.4)
WBC: 8.8 10*3/uL (ref 3.4–10.8)

## 2022-09-09 LAB — HEMOGLOBIN A1C
Est. average glucose Bld gHb Est-mCnc: 120 mg/dL
Hgb A1c MFr Bld: 5.8 % — ABNORMAL HIGH (ref 4.8–5.6)

## 2022-09-09 LAB — LIPID PANEL
Chol/HDL Ratio: 3.4 ratio (ref 0.0–4.4)
Cholesterol, Total: 191 mg/dL (ref 100–199)
HDL: 57 mg/dL (ref >39)
LDL Chol Calc (NIH): 108 mg/dL — ABNORMAL HIGH (ref 0–99)
Triglycerides: 149 mg/dL (ref 0–149)
VLDL Cholesterol Cal: 26 mg/dL (ref 5–40)

## 2022-09-09 LAB — TSH+FREE T4
Free T4: 1.12 ng/dL (ref 0.82–1.77)
TSH: 3.32 u[IU]/mL (ref 0.450–4.500)

## 2022-09-09 NOTE — Progress Notes (Signed)
Labs are normal and stable with exception of A1c which indicated pre-diabetes. Continue to recommend balanced, lower carb meals. Smaller meal size, adding snacks. Choosing water as drink of choice and increasing purposeful exercise.

## 2022-11-07 ENCOUNTER — Other Ambulatory Visit: Payer: Self-pay | Admitting: Family Medicine

## 2023-01-06 IMAGING — CR DG CHEST 2V
1 series · 2 of 2 positions shown · non-contrast
Comparison: 02/12/2021

CLINICAL DATA: Cough

EXAM:
CHEST - 2 VIEW

[Series 1: dg chest 2 view · 0.14mm/px · 2 of 2 slices shown]
[im 1/2]
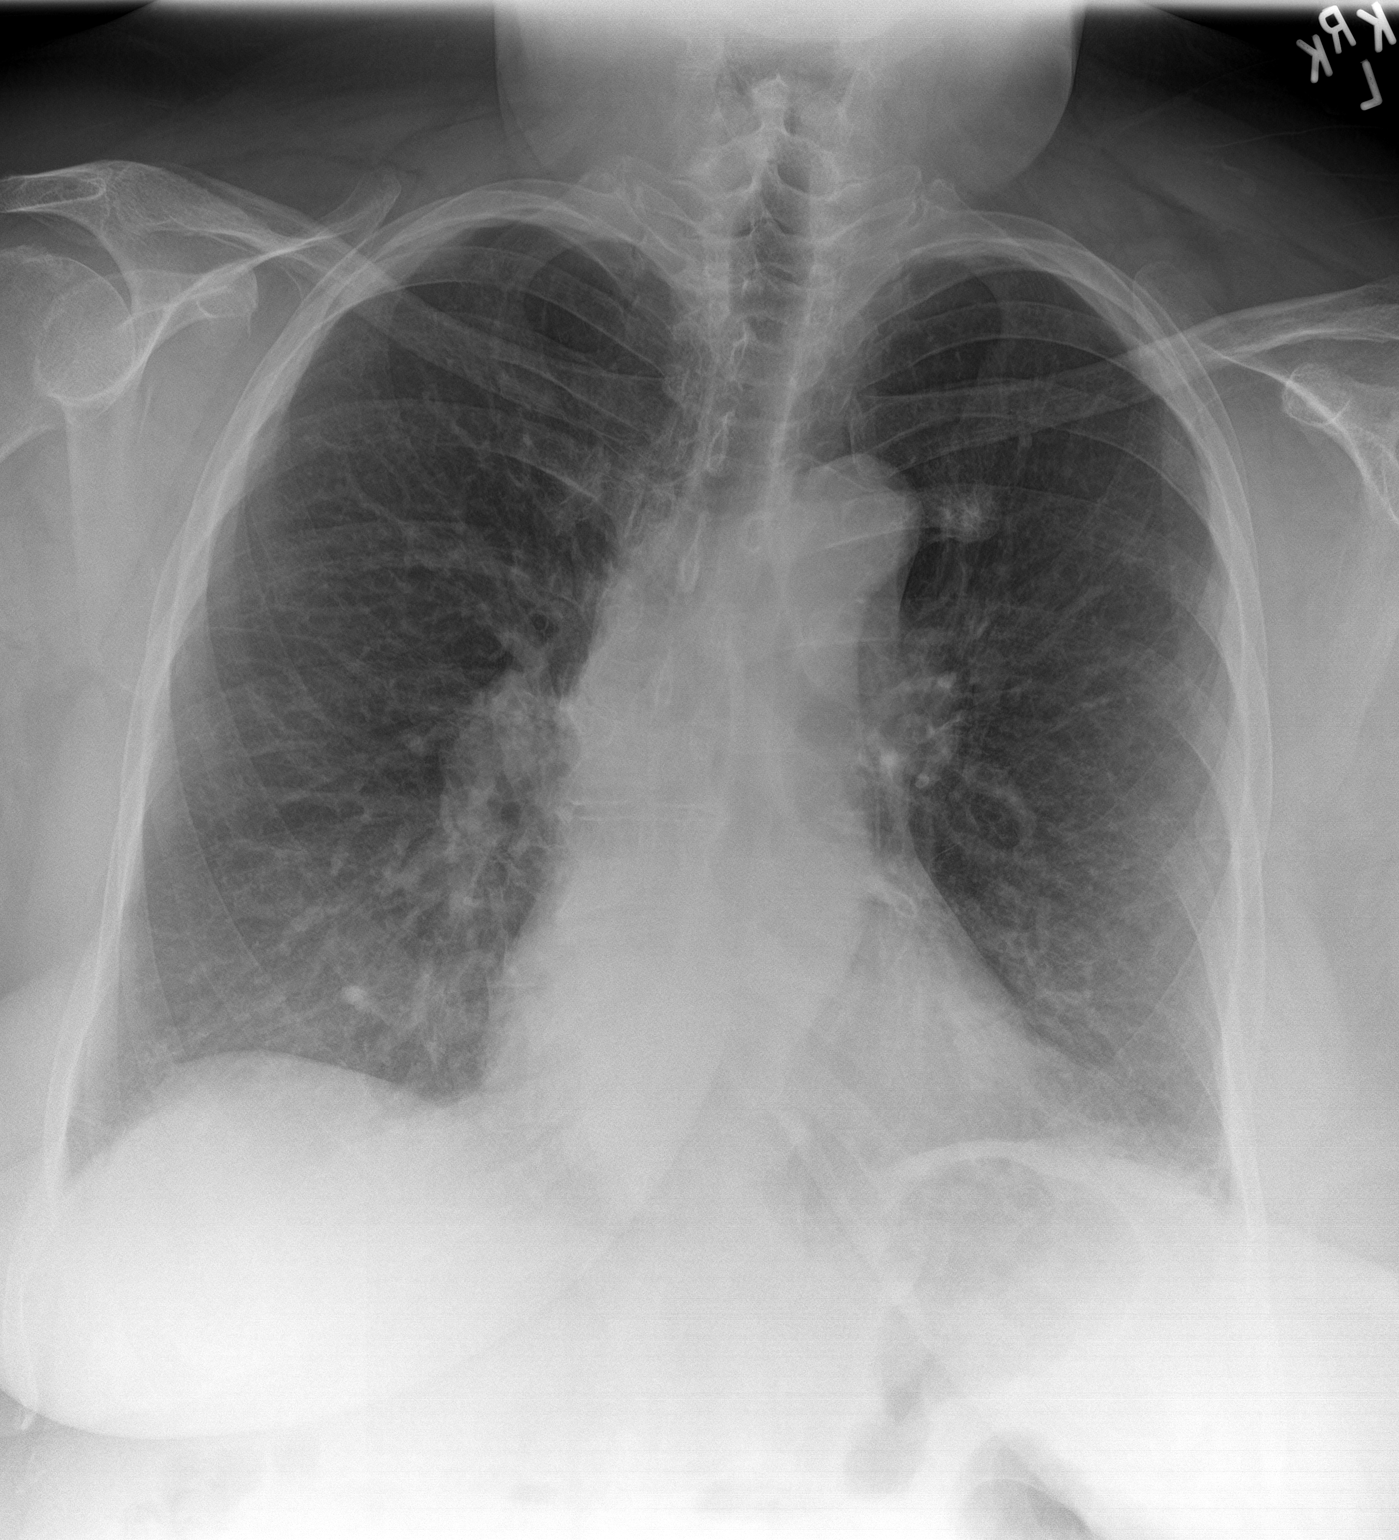
[im 2/2]
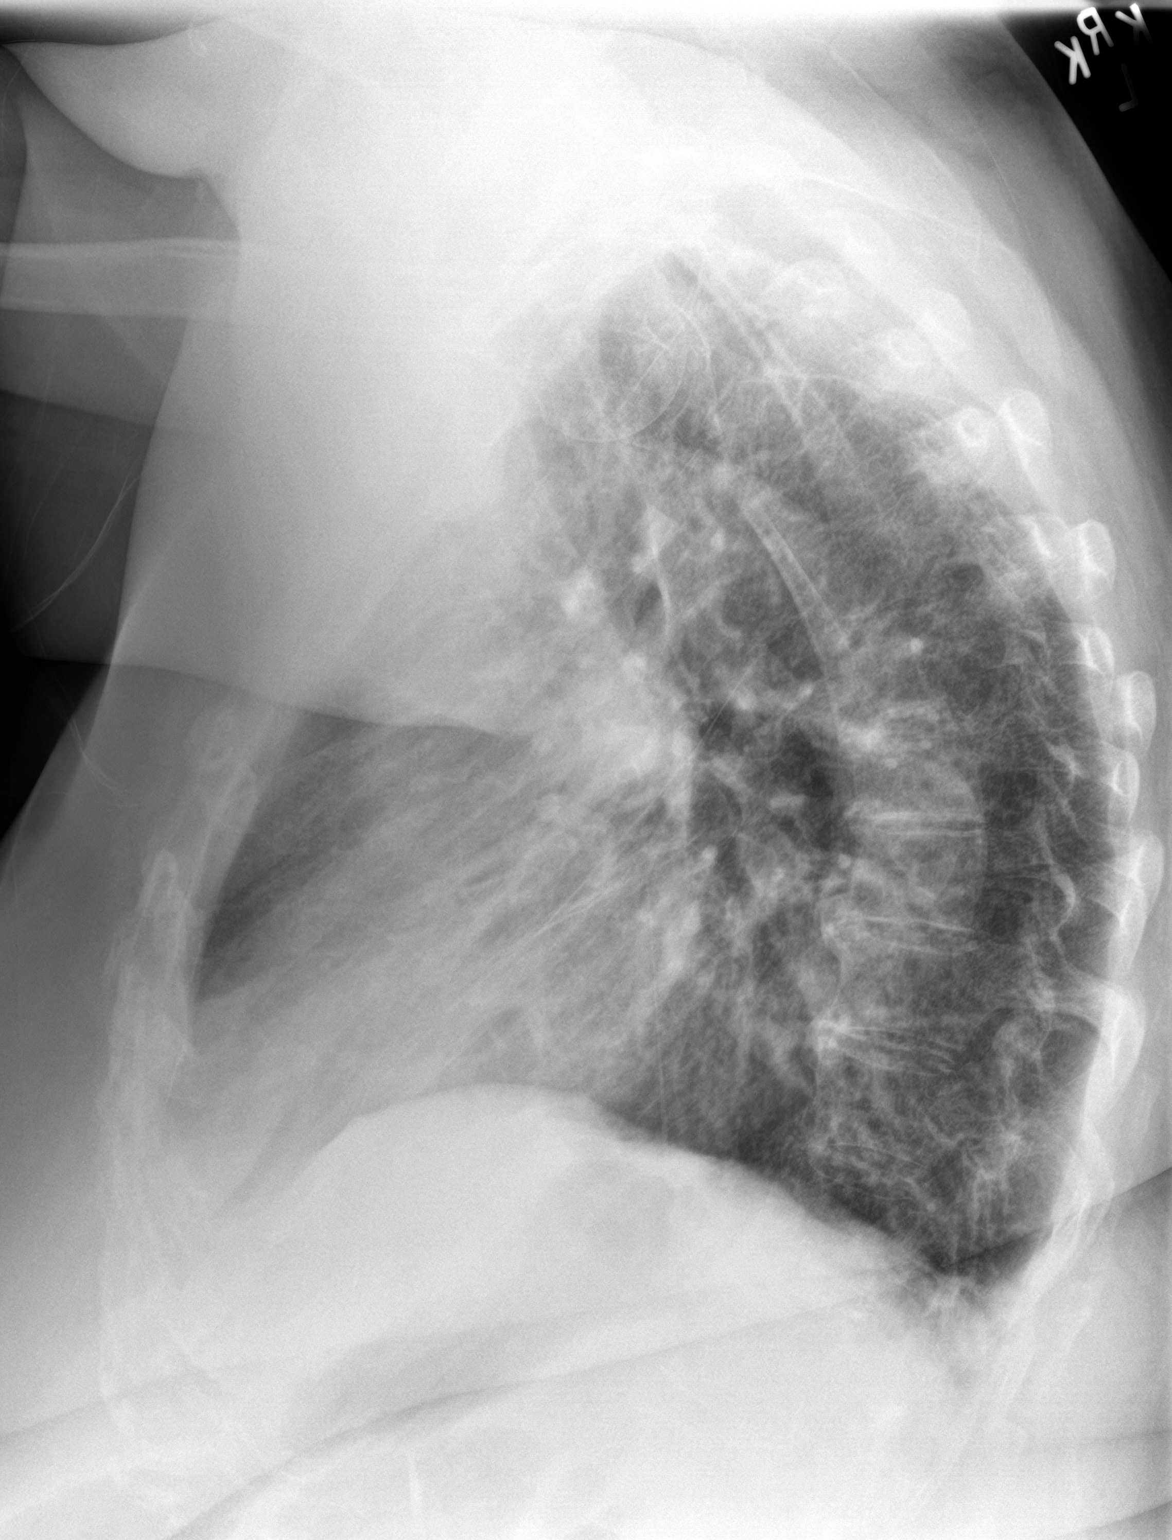

[2 of 2 positions shown; findings below may reference images not displayed]

FINDINGS: Cardiac size is within normal limits. Thoracic aorta is tortuous and
ectatic. Lung fields are clear of any infiltrates or pulmonary
edema. There is no pleural effusion or pneumothorax.
IMPRESSION: No active cardiopulmonary disease.

## 2023-01-18 ENCOUNTER — Telehealth: Payer: Self-pay | Admitting: *Deleted

## 2023-01-18 NOTE — Patient Outreach (Signed)
  Care Coordination   Follow Up Visit Note   01/18/2023 Name: [name] MRN: [id number] DOB: [birth year]  [name] is a 67 y.o. year old female who sees Jacky Kindle, FNP for primary care. I spoke with  [name] by phone today.  What matters to the patients health and wellness today?  State she is doing well, denies needs at this time and denies need for further calls. Agrees to receive [facility] brochure for future needs.       SDOH assessments and interventions completed:  No     Care Coordination Interventions:  No, not indicated   Follow up plan: No further intervention required.   Encounter Outcome:  Pt. Refused   Kemper Durie, RN, MSN, [facility] Care Management Care Management Coordinator [phone]

## 2023-02-26 ENCOUNTER — Other Ambulatory Visit: Payer: Self-pay | Admitting: Family Medicine

## 2023-02-26 DIAGNOSIS — I10 Essential (primary) hypertension: Secondary | ICD-10-CM

## 2023-02-28 NOTE — Telephone Encounter (Signed)
Requested Prescriptions  Pending Prescriptions Disp Refills   olmesartan (BENICAR) 5 MG tablet [Pharmacy Med Name: OLMESARTAN MEDOXOMIL 5MG  TABLETS] 90 tablet 0    Sig: TAKE 1 TABLET(5 MG) BY MOUTH DAILY     Cardiovascular:  Angiotensin Receptor Blockers Passed - 02/26/2023  6:36 AM      Passed - Cr in normal range and within 180 days    Creatinine  Date Value Ref Range Status  11/03/2012 0.76 0.60 - 1.30 mg/dL Final   Creatinine, Ser  Date Value Ref Range Status  09/08/2022 0.76 0.57 - 1.00 mg/dL Final         Passed - K in normal range and within 180 days    Potassium  Date Value Ref Range Status  09/08/2022 5.1 3.5 - 5.2 mmol/L Final  11/03/2012 3.8 3.5 - 5.1 mmol/L Final         Passed - Patient is not pregnant      Passed - Last BP in normal range    BP Readings from Last 1 Encounters:  09/08/22 130/80         Passed - Valid encounter within last 6 months    Recent Outpatient Visits           5 months ago Medicare annual wellness visit, initial   [facility] Jacky Kindle, FNP   1 year ago Essential hypertension   [facility] Merita Norton T, FNP   1 year ago Acute non-recurrent pansinusitis   [facility] Merita Norton T, FNP   1 year ago Moderate persistent reactive airway disease with acute exacerbation   [facility] Jacky Kindle, FNP   1 year ago Annual physical exam   [facility] Chrism[place], [place], [place]

## 2023-06-20 ENCOUNTER — Ambulatory Visit: Payer: Self-pay | Admitting: *Deleted

## 2023-06-20 ENCOUNTER — Encounter: Payer: Self-pay | Admitting: Family Medicine

## 2023-06-20 ENCOUNTER — Ambulatory Visit (INDEPENDENT_AMBULATORY_CARE_PROVIDER_SITE_OTHER): Payer: Medicare Other | Admitting: Family Medicine

## 2023-06-20 VITALS — BP 145/90 | HR 101 | Temp 98.5°F | Resp 16 | Ht 66.0 in | Wt 297.8 lb

## 2023-06-20 DIAGNOSIS — G459 Transient cerebral ischemic attack, unspecified: Secondary | ICD-10-CM | POA: Diagnosis not present

## 2023-06-20 DIAGNOSIS — R42 Dizziness and giddiness: Secondary | ICD-10-CM

## 2023-06-20 DIAGNOSIS — H532 Diplopia: Secondary | ICD-10-CM

## 2023-06-20 MED ORDER — ASPIRIN 81 MG PO TBEC: 162.0000 mg | DELAYED_RELEASE_TABLET | Freq: Every day | ORAL | Status: AC

## 2023-06-20 NOTE — Telephone Encounter (Signed)
  Chief Complaint: double vision this morning Symptoms: woozy, double vision this am- did have high BP- but had not taken BP medication.  Frequency: today Pertinent Negatives: Patient denies fever, chest pain, vomiting, diarrhea, bleeding  Disposition: [] ED /[] Urgent Care (no appt availability in office) / [x] Appointment(In office/virtual)/ []  [facility]/ [] Home Care/ [] Refused Recommended Disposition /[] [facility]/ []  Follow-up with PCP Additional Notes: Patient has been scheduled for appointment with provider for evaluation of new onset symptoms

## 2023-06-20 NOTE — Progress Notes (Signed)
Established patient visit   Patient: [name]   DOB: 1956/05/17   67 y.o. Female  MRN: [id number] Visit Date: 06/20/2023  Today's healthcare provider: Mila Merry, MD   Chief Complaint  Patient presents with   Dizziness   Hypertension   Subjective    Discussed the use of AI scribe software for clinical note transcription with the patient, who gave verbal consent to proceed.  History of Present Illness   The patient, with a history of hypertension, presented with an episode of sudden onset double vision and lightheadedness this morning that lasted approximately thirty seconds. The patient reported feeling unwell throughout the day following the episode. She also experienced a similar episode of lightheadedness while waiting in traffic. The patient has had minor episodes of double vision and lightheadedness approximately three times this year, but the episode on the day of presentation was more severe.  The patient reported no chest pain or palpitations during the episode. She also denied any prodromal symptoms before the onset of dizziness and double vision. The patient's blood pressure was elevated at 167/90 mmHg immediately after the episode, but it normalized to 127/84 mmHg later in the day.  The patient has a history of an episode about a year ago, which was initially suspected to be a heart attack but was later diagnosed as shingles. She also reported a history of shortness of breath with exertion, which she attributes to her weight. The patient denied any smoking or drinking habits.  The patient's family history is significant for cardiovascular disease, with her father having had a hole in his heart and dying of a massive heart attack, and her mother having had several health issues. The patient's parents passed away at the ages of 72 and 56, respectively.  The patient reported regular swelling in her feet and legs, which she attributes to inconsistent use of her  diuretic medication. She reported no increase in swelling on the day of the episode. The patient also reported having a headache since the episode, which was unusual for her. She denied taking any new medications, other than her regular blood pressure medication.       Medications: Outpatient Medications Prior to Visit  Medication Sig   Azelastine HCl 0.15 % SOLN Place 1 spray into the nose in the morning and at bedtime.   cetirizine (ZYRTEC) 10 MG tablet Take 1 tablet (10 mg total) by mouth daily.   olmesartan (BENICAR) 5 MG tablet TAKE 1 TABLET(5 MG) BY MOUTH DAILY   scopolamine (TRANSDERM-SCOP) 1 MG/3DAYS Place 1 patch (1.5 mg total) onto the skin every 3 (three) days.   torsemide (DEMADEX) 20 MG tablet Take 1 tablet (20 mg total) by mouth 2 (two) times daily.   budesonide (PULMICORT FLEXHALER) 180 MCG/ACT inhaler Inhale 1 puff into the lungs in the morning and at bedtime.   No facility-administered medications prior to visit.   Review of Systems     Objective    BP (!) 145/90 (BP Location: Left Arm, Patient Position: Sitting, Cuff Size: Large)   Pulse (!) 101   Temp 98.5 F (36.9 C) (Oral)   Resp 16   Ht 5\' 6"  (1.676 m)   Wt 297 lb 12.8 oz (135.1 kg)   SpO2 98%   BMI 48.07 kg/m    Physical Exam   General Appearance:    Obese female, alert, cooperative, in no acute distress  Eyes:    PERRL, conjunctiva/corneas clear, EOM's intact  Lungs:     Clear to auscultation bilaterally, respirations unlabored  Heart:    Tachycardic. Normal rhythm. No carotid bruits. No murmurs, rubs, or gallops.    Neurologic:   Awake, alert, oriented x 3. No apparent focal neurological           defect.       Assessment & Plan        Transient Ischemic Attack (TIA) (suspected) Sudden onset of double vision and lightheadedness lasting approximately 30 seconds, with a few similar but less severe episodes earlier this year. No associated chest pain or palpitations. -Order comprehensive blood  work including glucose, kidney function, and thyroid function. -Order carotid and vertebral artery ultrasound. -Increase Aspirin to two baby aspirin daily. -Continue current blood pressure medications without changes.  Hypertension Blood pressure readings varied throughout the day, with a high of 167/100 and a low of 127/74. Patient reports occasional noncompliance with diuretic medication. -Continue current blood pressure medications.  Follow-up Await lab results and ultrasound scheduling.    Consider neuroimaging. Consider long term cardiac monitor.      Mila Merry, MD  [facility] [phone] (phone) [phone] (fax)  [facility]

## 2023-06-20 NOTE — Telephone Encounter (Signed)
Summary: Double vision   Double vision, head feels woozy  Best contact: [phone]     Reason for Disposition  [1] MILD dizziness (e.g., walking normally) AND [2] has NOT been evaluated by doctor (or NP/PA) for this  (Exception: Dizziness caused by heat exposure, sudden standing, or poor fluid intake.)  Answer Assessment - Initial Assessment Questions 1. DESCRIPTION: "Describe your dizziness."     Lightheaded, double vision 2. LIGHTHEADED: "Do you feel lightheaded?" (e.g., somewhat faint, woozy, weak upon standing)     woozy 3. VERTIGO: "Do you feel like either you or the room is spinning or tilting?" (i.e. vertigo)     no 4. SEVERITY: "How bad is it?"  "Do you feel like you are going to faint?" "Can you stand and walk?"   - MILD: Feels slightly dizzy, but walking normally.   - MODERATE: Feels unsteady when walking, but not falling; interferes with normal activities (e.g., school, work).   - SEVERE: Unable to walk without falling, or requires assistance to walk without falling; feels like passing out now.      mild 5. ONSET:  "When did the dizziness begin?"     Today- this morning 6. AGGRAVATING FACTORS: "Does anything make it worse?" (e.g., standing, change in head position)     Watching TV 7. HEART RATE: "Can you tell me your heart rate?" "How many beats in 15 seconds?"  (Note: not all patients can do this)       167/97, 160/101- patient used wrist cuff- before and after BP medication this morning 8. CAUSE: "What do you think is causing the dizziness?"     Not sure 9. RECURRENT SYMPTOM: "Have you had dizziness before?" If Yes, ask: "When was the last time?" "What happened that time?"     Has had episodes in the past- not this pronounced  10. OTHER SYMPTOMS: "Do you have any other symptoms?" (e.g., fever, chest pain, vomiting, diarrhea, bleeding)       Vision blurred this morning  Protocols used: Dizziness - Lightheadedness-A-AH

## 2023-06-21 ENCOUNTER — Inpatient Hospital Stay
Admission: RE | Admit: 2023-06-21 | Discharge: 2023-06-21 | Discharge status: Home / Self Care | Payer: Medicare Other | Source: Ambulatory Visit | Attending: Family Medicine | Admitting: Family Medicine

## 2023-06-21 DIAGNOSIS — Z8673 Personal history of transient ischemic attack (TIA), and cerebral infarction without residual deficits: Secondary | ICD-10-CM | POA: Diagnosis not present

## 2023-06-21 DIAGNOSIS — H532 Diplopia: Secondary | ICD-10-CM

## 2023-06-21 DIAGNOSIS — G459 Transient cerebral ischemic attack, unspecified: Secondary | ICD-10-CM

## 2023-06-21 DIAGNOSIS — R42 Dizziness and giddiness: Secondary | ICD-10-CM

## 2023-06-21 LAB — CBC WITH DIFFERENTIAL/PLATELET
Basophils Absolute: 0.1 10*3/uL (ref 0.0–0.2)
Basos: 1 %
EOS (ABSOLUTE): 0.2 10*3/uL (ref 0.0–0.4)
Eos: 2 %
Hematocrit: 44.6 % (ref 34.0–46.6)
Hemoglobin: 14.1 g/dL (ref 11.1–15.9)
Immature Grans (Abs): 0 10*3/uL (ref 0.0–0.1)
Immature Granulocytes: 0 %
Lymphocytes Absolute: 3.1 10*3/uL (ref 0.7–3.1)
Lymphs: 28 %
MCH: 26.6 pg (ref 26.6–33.0)
MCHC: 31.6 g/dL (ref 31.5–35.7)
MCV: 84 fL (ref 79–97)
Monocytes Absolute: 1.3 10*3/uL — ABNORMAL HIGH (ref 0.1–0.9)
Monocytes: 12 %
Neutrophils Absolute: 6.4 10*3/uL (ref 1.4–7.0)
Neutrophils: 57 %
Platelets: 299 10*3/uL (ref 150–450)
RBC: 5.31 x10E6/uL — ABNORMAL HIGH (ref 3.77–5.28)
RDW: 14.4 % (ref 11.7–15.4)
WBC: 11.1 10*3/uL — ABNORMAL HIGH (ref 3.4–10.8)

## 2023-06-21 LAB — COMPREHENSIVE METABOLIC PANEL
ALT: 41 [IU]/L — ABNORMAL HIGH (ref 0–32)
AST: 34 [IU]/L (ref 0–40)
Albumin: 4.3 g/dL (ref 3.9–4.9)
Alkaline Phosphatase: 86 [IU]/L (ref 44–121)
BUN/Creatinine Ratio: 23 (ref 12–28)
BUN: 18 mg/dL (ref 8–27)
Bilirubin Total: 0.3 mg/dL (ref 0.0–1.2)
CO2: 25 mmol/L (ref 20–29)
Calcium: 10.5 mg/dL — ABNORMAL HIGH (ref 8.7–10.3)
Chloride: 102 mmol/L (ref 96–106)
Creatinine, Ser: 0.77 mg/dL (ref 0.57–1.00)
Globulin, Total: 2.6 g/dL (ref 1.5–4.5)
Glucose: 99 mg/dL (ref 70–99)
Potassium: 4.6 mmol/L (ref 3.5–5.2)
Sodium: 142 mmol/L (ref 134–144)
Total Protein: 6.9 g/dL (ref 6.0–8.5)
eGFR: 84 mL/min/{1.73_m2} (ref >59)

## 2023-06-21 LAB — LDL CHOLESTEROL, DIRECT: LDL Direct: 114 mg/dL — ABNORMAL HIGH (ref 0–99)

## 2023-06-21 LAB — HEMOGLOBIN A1C
Est. average glucose Bld gHb Est-mCnc: 123 mg/dL
Hgb A1c MFr Bld: 5.9 % — ABNORMAL HIGH (ref 4.8–5.6)

## 2023-06-21 LAB — MAGNESIUM: Magnesium: 2.2 mg/dL (ref 1.6–2.3)

## 2023-06-21 LAB — TSH: TSH: 2.94 u[IU]/mL (ref 0.450–4.500)

## 2023-06-21 LAB — LIPOPROTEIN A (LPA): Lipoprotein (a): 73.4 nmol/L (ref <75.0)

## 2023-07-07 ENCOUNTER — Telehealth: Payer: Self-pay | Admitting: Family Medicine

## 2023-07-07 ENCOUNTER — Ambulatory Visit: Payer: Medicare Other | Admitting: Family Medicine

## 2023-07-13 ENCOUNTER — Encounter: Payer: Self-pay | Admitting: Family Medicine

## 2023-07-13 ENCOUNTER — Ambulatory Visit (INDEPENDENT_AMBULATORY_CARE_PROVIDER_SITE_OTHER): Payer: Medicare Other | Admitting: Family Medicine

## 2023-07-13 VITALS — BP 133/83 | HR 83 | Resp 16 | Ht 66.0 in | Wt 299.4 lb

## 2023-07-13 DIAGNOSIS — J683 Other acute and subacute respiratory conditions due to chemicals, gases, fumes and vapors: Secondary | ICD-10-CM

## 2023-07-13 DIAGNOSIS — R600 Localized edema: Secondary | ICD-10-CM

## 2023-07-13 DIAGNOSIS — I771 Stricture of artery: Secondary | ICD-10-CM | POA: Diagnosis not present

## 2023-07-13 DIAGNOSIS — I10 Essential (primary) hypertension: Secondary | ICD-10-CM

## 2023-07-13 DIAGNOSIS — E7800 Pure hypercholesterolemia, unspecified: Secondary | ICD-10-CM

## 2023-07-13 MED ORDER — SEMAGLUTIDE(0.25 OR 0.5MG/DOS) 2 MG/3ML ~~LOC~~ SOPN: 0.2500 mg | PEN_INJECTOR | SUBCUTANEOUS | 0 refills | Status: AC

## 2023-07-13 MED ORDER — OLMESARTAN MEDOXOMIL 5 MG PO TABS: 5.0000 mg | ORAL_TABLET | Freq: Every day | ORAL | 3 refills | Status: DC

## 2023-07-13 MED ORDER — ROSUVASTATIN CALCIUM 20 MG PO TABS: 20.0000 mg | ORAL_TABLET | Freq: Every day | ORAL | 2 refills | Status: DC

## 2023-07-13 NOTE — Assessment & Plan Note (Signed)
Chronic, slight elevation Continues to work on diet and exercise to assist weight mgmt and use of PRN diuretics to assist fluid and exercise intolerance Currently on benicar 5 mg daily; consider increase to 10 mg

## 2023-07-13 NOTE — Assessment & Plan Note (Signed)
Chronic, stable No longer using pulmicort Continue to monitor exercise intolerance/DOE given morbid obesity

## 2023-07-13 NOTE — Patient Instructions (Signed)
It was a pleasure to take care of you!

## 2023-07-13 NOTE — Progress Notes (Signed)
Established patient visit  Patient: [name]   DOB: 1956-01-25   67 y.o. Female  MRN: [id number] Visit Date: 07/13/2023  Today's healthcare provider: Jacky Kindle, FNP  Introduced to nurse practitioner role and practice setting.  All questions answered.  Discussed provider/patient relationship and expectations.  Chief Complaint  Patient presents with   Follow-up, possible TIA   Subjective    HPI   The 10-year ASCVD risk score (Arnett DK, et al., 2019) is: 9.6%  Medications: Outpatient Medications Prior to Visit  Medication Sig   aspirin EC 81 MG tablet Take 2 tablets (162 mg total) by mouth daily. Swallow whole.   Azelastine HCl 0.15 % SOLN Place 1 spray into the nose in the morning and at bedtime.   budesonide (PULMICORT FLEXHALER) 180 MCG/ACT inhaler Inhale 1 puff into the lungs in the morning and at bedtime.   cetirizine (ZYRTEC) 10 MG tablet Take 1 tablet (10 mg total) by mouth daily.   scopolamine (TRANSDERM-SCOP) 1 MG/3DAYS Place 1 patch (1.5 mg total) onto the skin every 3 (three) days.   torsemide (DEMADEX) 20 MG tablet Take 1 tablet (20 mg total) by mouth 2 (two) times daily.   [DISCONTINUED] olmesartan (BENICAR) 5 MG tablet TAKE 1 TABLET(5 MG) BY MOUTH DAILY   No facility-administered medications prior to visit.   Last CBC Lab Results  Component Value Date   WBC 11.1 (H) 06/20/2023   HGB 14.1 06/20/2023   HCT 44.6 06/20/2023   MCV 84 06/20/2023   MCH 26.6 06/20/2023   RDW 14.4 06/20/2023   PLT 299 06/20/2023   Last metabolic panel Lab Results  Component Value Date   GLUCOSE 99 06/20/2023   NA 142 06/20/2023   K 4.6 06/20/2023   CL 102 06/20/2023   CO2 25 06/20/2023   BUN 18 06/20/2023   CREATININE 0.77 06/20/2023   EGFR 84 06/20/2023   CALCIUM 10.5 (H) 06/20/2023   PROT 6.9 06/20/2023   ALBUMIN 4.3 06/20/2023   LABGLOB 2.6 06/20/2023   AGRATIO 1.7 09/08/2022   BILITOT 0.3 06/20/2023   ALKPHOS 86 06/20/2023   AST 34 06/20/2023   ALT 41  (H) 06/20/2023   ANIONGAP 11 12/04/2021   Last lipids Lab Results  Component Value Date   CHOL 191 09/08/2022   HDL 57 09/08/2022   LDLCALC 108 (H) 09/08/2022   LDLDIRECT 114 (H) 06/20/2023   TRIG 149 09/08/2022   CHOLHDL 3.4 09/08/2022   Last hemoglobin A1c Lab Results  Component Value Date   HGBA1C 5.9 (H) 06/20/2023   Last thyroid functions Lab Results  Component Value Date   TSH 2.940 06/20/2023   Last vitamin D No results found for: "25OHVITD2", "25OHVITD3", "VD25OH" Last vitamin B12 and Folate No results found for: "VITAMINB12", "FOLATE"     Objective    BP 133/83 (BP Location: Right Arm, Patient Position: Sitting, Cuff Size: Large)   Pulse 83   Resp 16   Ht 5\' 6"  (1.676 m)   Wt 299 lb 6.4 oz (135.8 kg)   BMI 48.32 kg/m   BP Readings from Last 3 Encounters:  07/13/23 133/83  06/20/23 (!) 145/90  09/08/22 130/80   Wt Readings from Last 3 Encounters:  07/13/23 299 lb 6.4 oz (135.8 kg)  06/20/23 297 lb 12.8 oz (135.1 kg)  09/08/22 296 lb (134.3 kg)   SpO2 Readings from Last 3 Encounters:  06/20/23 98%  09/08/22 98%  01/15/22 98%   Physical Exam Vitals and nursing note reviewed.  Constitutional:      General: She is not in acute distress.    Appearance: Normal appearance. She is obese. She is not ill-appearing, toxic-appearing or diaphoretic.  HENT:     Head: Normocephalic and atraumatic.  Cardiovascular:     Rate and Rhythm: Normal rate and regular rhythm.     Pulses: Normal pulses.     Heart sounds: Normal heart sounds. No murmur heard.    No friction rub. No gallop.  Pulmonary:     Effort: Pulmonary effort is normal. No respiratory distress.     Breath sounds: Normal breath sounds. No stridor. No wheezing, rhonchi or rales.  Chest:     Chest wall: No tenderness.  Musculoskeletal:        General: No swelling, tenderness, deformity or signs of injury. Normal range of motion.     Right lower leg: No edema.     Left lower leg: No edema.   Skin:    General: Skin is warm and dry.     Capillary Refill: Capillary refill takes less than 2 seconds.     Coloration: Skin is not jaundiced or pale.     Findings: No bruising, erythema, lesion or rash.  Neurological:     General: No focal deficit present.     Mental Status: She is alert and oriented to person, place, and time. Mental status is at baseline.     Cranial Nerves: No cranial nerve deficit.     Sensory: No sensory deficit.     Motor: No weakness.     Coordination: Coordination normal.  Psychiatric:        Mood and Affect: Mood normal.        Behavior: Behavior normal.        Thought Content: Thought content normal.        Judgment: Judgment normal.    No results found for any visits on 07/13/23.  Assessment & Plan     Problem List Items Addressed This Visit       Cardiovascular and Mediastinum   Essential hypertension - Primary   Chronic, slight elevation Continues to work on diet and exercise to assist weight mgmt and use of PRN diuretics to assist fluid and exercise intolerance Currently on benicar 5 mg daily; consider increase to 10 mg       Relevant Medications   olmesartan (BENICAR) 5 MG tablet   rosuvastatin (CRESTOR) 20 MG tablet   Tortuous aorta (HCC)   Chronic, stable The 10-year ASCVD risk score (Arnett DK, et al., 2019) is: 9.6% recommend diet low in saturated fat and regular exercise - 30 min at least 5 times per week Trial of Crestor to assist given concern for previous TIA 3 weeks ago; symptoms have resolved      Relevant Medications   olmesartan (BENICAR) 5 MG tablet   rosuvastatin (CRESTOR) 20 MG tablet     Respiratory   Reactive airways dysfunction syndrome (HCC)   Chronic, stable No longer using pulmicort Continue to monitor exercise intolerance/DOE given morbid obesity         Other   Elevated LDL cholesterol level   Chronic, previously untreated Discussed start of statin to assist 30# trial with 2 refills 1 month f/u  with new PCP Dr Roxan Hockey       Morbid obesity (HCC)   Chronic, stable Discussed importance of healthy weight management Discussed diet and exercise Previously recommended use of Ozempic to assist; will re prescribe      Relevant  Medications   Semaglutide,0.25 or 0.5MG /DOS, 2 MG/3ML SOPN   Peripheral edema   Chronic, variable Previously on torsemine 20 mg BID; however, pt reports daily use at 3pm to allow time for diuresis Declines referral back to cardiology following TIA symptoms at this time CTM      No follow-ups on file.     Leilani Merl, FNP, have reviewed all documentation for this visit. The documentation on 07/13/23 for the exam, diagnosis, procedures, and orders are all accurate and complete.  Jacky Kindle, FNP  [facility] [phone] (phone) [phone] (fax)  [facility]

## 2023-07-13 NOTE — Assessment & Plan Note (Signed)
Chronic, stable The 10-year ASCVD risk score (Arnett DK, et al., 2019) is: 9.6% recommend diet low in saturated fat and regular exercise - 30 min at least 5 times per week Trial of Crestor to assist given concern for previous TIA 3 weeks ago; symptoms have resolved

## 2023-07-13 NOTE — Assessment & Plan Note (Signed)
Chronic, variable Previously on torsemine 20 mg BID; however, pt reports daily use at 3pm to allow time for diuresis Declines referral back to cardiology following TIA symptoms at this time CTM

## 2023-07-13 NOTE — Assessment & Plan Note (Signed)
Chronic, previously untreated Discussed start of statin to assist 30# trial with 2 refills 1 month f/u with new PCP Dr Roxan Hockey

## 2023-07-13 NOTE — Assessment & Plan Note (Signed)
Chronic, stable Discussed importance of healthy weight management Discussed diet and exercise Previously recommended use of Ozempic to assist; will re prescribe

## 2023-08-09 ENCOUNTER — Encounter: Payer: Medicare Other | Admitting: Family Medicine

## 2023-08-10 ENCOUNTER — Encounter: Payer: Medicare Other | Admitting: Family Medicine

## 2023-09-19 ENCOUNTER — Encounter: Payer: Medicare Other | Admitting: Family Medicine

## 2023-09-21 ENCOUNTER — Encounter: Payer: Self-pay | Admitting: Family Medicine

## 2023-10-06 ENCOUNTER — Encounter: Payer: Self-pay | Admitting: Family Medicine

## 2023-10-06 ENCOUNTER — Ambulatory Visit (INDEPENDENT_AMBULATORY_CARE_PROVIDER_SITE_OTHER): Payer: Medicare Other | Admitting: Family Medicine

## 2023-10-06 VITALS — BP 135/72 | HR 85 | Ht 66.0 in | Wt 299.0 lb

## 2023-10-06 DIAGNOSIS — Z13820 Encounter for screening for osteoporosis: Secondary | ICD-10-CM

## 2023-10-06 DIAGNOSIS — R600 Localized edema: Secondary | ICD-10-CM

## 2023-10-06 DIAGNOSIS — Z0001 Encounter for general adult medical examination with abnormal findings: Secondary | ICD-10-CM

## 2023-10-06 DIAGNOSIS — Z1231 Encounter for screening mammogram for malignant neoplasm of breast: Secondary | ICD-10-CM

## 2023-10-06 DIAGNOSIS — I10 Essential (primary) hypertension: Secondary | ICD-10-CM

## 2023-10-06 DIAGNOSIS — Z0000 Encounter for general adult medical examination without abnormal findings: Secondary | ICD-10-CM | POA: Insufficient documentation

## 2023-10-06 DIAGNOSIS — E7800 Pure hypercholesterolemia, unspecified: Secondary | ICD-10-CM

## 2023-10-06 DIAGNOSIS — Z780 Asymptomatic menopausal state: Secondary | ICD-10-CM

## 2023-10-06 NOTE — Patient Instructions (Addendum)
 Health Maintenance  Topic Date Due   Pneumonia Vaccine 93+ Years old (1 of 2 - PCV) Never done   Zoster Vaccines- Shingrix (1 of 2) Never done   DEXA SCAN  Never done   COVID-19 Vaccine (6 - 2024-25 season) 03/27/2023   INFLUENZA VACCINE  10/24/2023 (Originally 02/24/2023)   MAMMOGRAM  08/06/2024   Medicare Annual Wellness (AWV)  10/05/2024   Fecal DNA (Cologuard)  12/07/2024   DTaP/Tdap/Td (3 - Td or Tdap) 01/30/2030   Hepatitis C Screening  Completed   HPV VACCINES  Aged Out   Colonoscopy  Discontinued   Health Maintenance Review Recommend - Shingrix vaccine    [facility] [address] [place],  [place]  [address] Main: [phone]

## 2023-10-06 NOTE — Progress Notes (Signed)
 Subjective:   [name] is a 68 y.o. female who presents for Medicare Annual (Subsequent) preventive examination.  Visit Complete: In person  Patient Medicare AWV questionnaire was completed by the patient on 10/06/23; I have confirmed that all information answered by patient is correct and no changes since this date.  Cardiac Risk Factors include: family history of premature cardiovascular disease;obesity (BMI >30kg/m2)     Objective:    Today's Vitals   10/06/23 0851  BP: 135/72  Pulse: 85  SpO2: 100%  Weight: 299 lb (135.6 kg)  Height: 5\' 6"  (1.676 m)  PainSc: 0-No pain   Body mass index is 48.26 kg/m.     09/08/2022    9:44 AM 02/12/2021    8:56 AM  Advanced Directives  Does Patient Have a Medical Advance Directive? No No  Would patient like information on creating a medical advance directive?  No - Patient declined    Current Medications (verified) Outpatient Encounter Medications as of 10/06/2023  Medication Sig   aspirin EC 81 MG tablet Take 2 tablets (162 mg total) by mouth daily. Swallow whole.   Azelastine HCl 0.15 % SOLN Place 1 spray into the nose in the morning and at bedtime.   budesonide (PULMICORT FLEXHALER) 180 MCG/ACT inhaler Inhale 1 puff into the lungs in the morning and at bedtime.   cetirizine (ZYRTEC) 10 MG tablet Take 1 tablet (10 mg total) by mouth daily.   olmesartan (BENICAR) 5 MG tablet Take 1 tablet (5 mg total) by mouth daily.   rosuvastatin (CRESTOR) 20 MG tablet Take 1 tablet (20 mg total) by mouth daily.   scopolamine (TRANSDERM-SCOP) 1 MG/3DAYS Place 1 patch (1.5 mg total) onto the skin every 3 (three) days.   torsemide (DEMADEX) 20 MG tablet Take 1 tablet (20 mg total) by mouth 2 (two) times daily.   Semaglutide,0.25 or 0.5MG /DOS, 2 MG/3ML SOPN Inject 0.25 mg into the skin once a week. (Patient not taking: Reported on 10/06/2023)   No facility-administered encounter medications on file as of 10/06/2023.    Allergies  (verified) Augmentin [amoxicillin-pot clavulanate] and Azithromycin   History: Past Medical History:  Diagnosis Date   Chronic sinusitis    Hypertension    Migraines    Morbid obesity (HCC)    Sensorineural hearing loss    Past Surgical History:  Procedure Laterality Date   ABDOMINAL HYSTERECTOMY  2008   Still has cervix   APPENDECTOMY  1967   BACK SURGERY     Ruptured disc.   BACK SURGERY  1985   Disc placed in back   TONSILLECTOMY  1960   Family History  Problem Relation Age of Onset   Bipolar disorder Mother    Emphysema Mother    Depression Mother    Ulcers Mother    Heart attack Father    Cancer Sister    Breast cancer Sister    Hypertension Sister    Edema Sister    Fibrocystic breast disease Sister    Hypertension Brother    Arthritis Brother    Colon cancer Maternal Grandmother    Heart attack Paternal Grandfather    Social History   Socioeconomic History   Marital status: Widowed    Spouse name: Not on file   Number of children: 4   Years of education: H/S   Highest education level: Not on file  Occupational History    Comment: Full-time 50-60 hours weekly  Tobacco Use   Smoking status: Never   Smokeless  tobacco: Never  Vaping Use   Vaping status: Never Used  Substance and Sexual Activity   Alcohol use: No   Drug use: No   Sexual activity: Not Currently  Other Topics Concern   Not on file  Social History Narrative   Not on file   Social Drivers of Health   Financial Resource Strain: Low Risk  (10/06/2023)   Overall Financial Resource Strain (CARDIA)    Difficulty of Paying Living Expenses: Not hard at all  Food Insecurity: No Food Insecurity (10/06/2023)   Hunger Vital Sign    Worried About Running Out of Food in the Last Year: Never true    Ran Out of Food in the Last Year: Never true  Transportation Needs: No Transportation Needs (10/06/2023)   PRAPARE - Administrator, Civil Service (Medical): No    Lack of Transportation  (Non-Medical): No  Physical Activity: Not on file  Stress: No Stress Concern Present (10/06/2023)   [employer] of Occupational Health - Occupational Stress Questionnaire    Feeling of Stress : Not at all  Social Connections: Not on file   Lab Results  Component Value Date   HGBA1C 5.9 (H) 06/20/2023     Chemistry      Component Value Date/Time   NA 142 06/20/2023 1632   NA 138 11/03/2012 1707   K 4.6 06/20/2023 1632   K 3.8 11/03/2012 1707   CL 102 06/20/2023 1632   CL 107 11/03/2012 1707   CO2 25 06/20/2023 1632   CO2 26 11/03/2012 1707   BUN 18 06/20/2023 1632   BUN 17 11/03/2012 1707   CREATININE 0.77 06/20/2023 1632   CREATININE 0.76 11/03/2012 1707      Component Value Date/Time   CALCIUM 10.5 (H) 06/20/2023 1632   CALCIUM 9.5 11/03/2012 1707   ALKPHOS 86 06/20/2023 1632   ALKPHOS 99 11/03/2012 1707   AST 34 06/20/2023 1632   AST 33 11/03/2012 1707   ALT 41 (H) 06/20/2023 1632   ALT 37 11/03/2012 1707   BILITOT 0.3 06/20/2023 1632   BILITOT 0.2 11/03/2012 1707      Tobacco Counseling Counseling given: Not Answered   Clinical Intake:  Pre-visit preparation completed: No  Pain : No/denies pain Pain Score: 0-No pain     Nutritional Risks: None Diabetes: No  How often do you need to have someone help you when you read instructions, pamphlets, or other written materials from your doctor or pharmacy?: 1 - Never  Interpreter Needed?: No      Activities of Daily Living    10/06/2023    8:46 AM  In your present state of health, do you have any difficulty performing the following activities:  Hearing? 0  Vision? 0  Difficulty concentrating or making decisions? 0  Walking or climbing stairs? 1  Comment she has bad knees  Dressing or bathing? 0  Doing errands, shopping? 0  Preparing Food and eating ? N  Using the Toilet? N  In the past six months, have you accidently leaked urine? Y  Do you have problems with loss of bowel control? N   Managing your Medications? N  Managing your Finances? N  Housekeeping or managing your Housekeeping? N    Patient Care Team: Jacky Kindle, FNP as PCP - General (Family Medicine)  Indicate any recent Medical Services you may have received from other than [facility] providers in the past year (date may be approximate).  Physical Exam Vitals reviewed.  Constitutional:  General: She is not in acute distress.    Appearance: Normal appearance. She is not ill-appearing, toxic-appearing or diaphoretic.  HENT:     Head: Normocephalic and atraumatic.     Right Ear: Tympanic membrane and external ear normal. There is no impacted cerumen.     Left Ear: Tympanic membrane and external ear normal. There is no impacted cerumen.     Nose: Nose normal.     Mouth/Throat:     Pharynx: Oropharynx is clear.  Eyes:     General: No scleral icterus.    Extraocular Movements: Extraocular movements intact.     Conjunctiva/sclera: Conjunctivae normal.     Pupils: Pupils are equal, round, and reactive to light.  Cardiovascular:     Rate and Rhythm: Normal rate and regular rhythm.     Pulses: Normal pulses.     Heart sounds: Normal heart sounds. No murmur heard.    No friction rub. No gallop.  Pulmonary:     Effort: Pulmonary effort is normal. No respiratory distress.     Breath sounds: Normal breath sounds. No wheezing, rhonchi or rales.  Abdominal:     General: Bowel sounds are normal. There is no distension.     Palpations: Abdomen is soft. There is no mass.     Tenderness: There is no abdominal tenderness. There is no guarding.  Musculoskeletal:        General: No deformity.     Cervical back: Normal range of motion and neck supple.     Right lower leg: No edema.     Left lower leg: No edema.  Lymphadenopathy:     Cervical: No cervical adenopathy.  Skin:    General: Skin is warm.     Capillary Refill: Capillary refill takes less than 2 seconds.     Findings: No erythema or rash.   Neurological:     General: No focal deficit present.     Mental Status: She is alert and oriented to person, place, and time.     Cranial Nerves: Cranial nerves 2-12 are intact. No cranial nerve deficit or facial asymmetry.     Motor: Motor function is intact. No weakness.     Gait: Gait normal.  Psychiatric:        Mood and Affect: Mood normal.        Behavior: Behavior normal.         Assessment:   This is a routine wellness examination for Decklyn.  Hearing/Vision screen No results found.   Goals Addressed   None   Depression Screen    10/06/2023    8:56 AM 09/08/2022    9:36 AM 10/02/2021    1:15 PM 03/24/2021    1:34 PM 02/16/2021    5:02 PM 01/31/2020    9:18 AM 12/14/2019   11:31 AM  PHQ 2/9 Scores  PHQ - 2 Score 0 0 0 0 0 0 0  PHQ- 9 Score 4 0 0 0 0 1     Fall Risk    09/08/2022    9:36 AM 10/02/2021    1:14 PM 03/24/2021    1:34 PM 02/16/2021    5:02 PM 01/31/2020    9:17 AM  Fall Risk   Falls in the past year? 0 0 0 0 0  Number falls in past yr: 0 0 0 0 0  Injury with Fall? 0 0 0 0 0  Risk for fall due to :  No Fall Risks No Fall Risks No Fall  Risks   Follow up   Falls evaluation completed      MEDICARE RISK AT HOME:    TIMED UP AND GO:  Was the test performed?  No    Cognitive Function:        09/08/2022    9:45 AM  6CIT Screen  What Year? 0 points  What month? 0 points  What time? 0 points  Count back from 20 0 points  Months in reverse 0 points  Repeat phrase 0 points  Total Score 0 points    Immunizations Immunization History  Administered Date(s) Administered   Influenza,inj,Quad PF,6+ Mos 05/12/2018   Influenza-Unspecified 05/15/2020, 04/27/2021, 05/25/2022   Moderna SARS-COV2 Booster Vaccination 07/14/2020   Moderna Sars-Covid-2 Vaccination 10/13/2019, 11/10/2019, 03/26/2021   Pfizer Covid-19 Vaccine Bivalent Booster 17yrs & up 05/26/2022   Tdap 04/10/2009, 01/31/2020    TDAP status: Up to date  Flu Vaccine status: Due,  Education has been provided regarding the importance of this vaccine. Advised may receive this vaccine at local pharmacy or Health Dept. Aware to provide a copy of the vaccination record if obtained from local pharmacy or Health Dept. Verbalized acceptance and understanding.  Pneumococcal vaccine status: Due, Education has been provided regarding the importance of this vaccine. Advised may receive this vaccine at local pharmacy or Health Dept. Aware to provide a copy of the vaccination record if obtained from local pharmacy or Health Dept. Verbalized acceptance and understanding.  Covid-19 vaccine status: not completed   Nov 2024 had pneumonia vaccine at her pharmacy - [facility]    Unable to get COVID vaccine due to stock limits    Qualifies for Shingles Vaccine? Yes   Zostavax completed No   Shingrix Completed?: No.    Education has been provided regarding the importance of this vaccine. Patient has been advised to call insurance company to determine out of pocket expense if they have not yet received this vaccine. Advised may also receive vaccine at local pharmacy or Health Dept. Verbalized acceptance and understanding.  Screening Tests Health Maintenance  Topic Date Due   Pneumonia Vaccine 78+ Years old (1 of 2 - PCV) Never done   Zoster Vaccines- Shingrix (1 of 2) Never done   DEXA SCAN  Never done   COVID-19 Vaccine (6 - 2024-25 season) 03/27/2023   INFLUENZA VACCINE  10/24/2023 (Originally 02/24/2023)   MAMMOGRAM  08/06/2024   Medicare Annual Wellness (AWV)  10/05/2024   Fecal DNA (Cologuard)  12/07/2024   DTaP/Tdap/Td (3 - Td or Tdap) 01/30/2030   Hepatitis C Screening  Completed   HPV VACCINES  Aged Out   Colonoscopy  Discontinued    Health Maintenance  Health Maintenance Due  Topic Date Due   Pneumonia Vaccine 32+ Years old (1 of 2 - PCV) Never done   Zoster Vaccines- Shingrix (1 of 2) Never done   DEXA SCAN  Never done   COVID-19 Vaccine (6 - 2024-25 season)  03/27/2023    Colorectal cancer screening: Type of screening: Cologuard. Completed 11/2021. Repeat every 3 years  Mammogram status: Completed 07/2022. Repeat every year   Bone Density status: Ordered  . Pt provided with contact info and advised to call to schedule appt.  Lung Cancer Screening: (Low Dose CT Chest recommended if Age 34-80 years, 20 pack-year currently smoking OR have quit w/in 15years.) does not qualify.   Lung Cancer Screening Referral: n/A  Additional Screening:  Hepatitis C Screening: does qualify; Completed 2022  Vision Screening: Recommended annual ophthalmology exams  for early detection of glaucoma and other disorders of the eye. Is the patient up to date with their annual eye exam?  Yes  Who is the provider or what is the name of the office in which the patient attends annual eye exams? [facility]   If pt is not established with a provider, would they like to be referred to a provider to establish care? No .   Dental Screening: Recommended annual dental exams for proper oral hygiene  Diabetic Foot Exam: N/A  Community Resource Referral / Chronic Care Management: CRR required this visit?  No   CCM required this visit?  No     Plan:     I have personally reviewed and noted the following in the patient's chart:   Medical and social history Use of alcohol, tobacco or illicit drugs  Current medications and supplements including opioid prescriptions. Patient is not currently taking opioid prescriptions. Functional ability and status Nutritional status Physical activity Advanced directives List of other physicians Hospitalizations, surgeries, and ER visits in previous 12 months Vitals Screenings to include cognitive, depression, and falls Referrals and appointments      Annual wellness visit A 68 year old female presents for her annual wellness visit. She requires the Shingrix vaccination and plans to update her COVID-19 vaccine. She received  the pneumococcal vaccine in November 2024. She is due for a bone density scan to assess osteoporosis risk and a mammogram. She is up to date with eye exams as of November 2024. She does not smoke tobacco. - Order bone density scan - Order mammogram - Recommend Shingrix vaccination - Encourage scheduling of COVID-19 vaccination - Promote regular eye exams - Advocate for a well-balanced diet with vegetables and protein - Encourage 30 minutes of exercise most days  Transient ischemic attack (TIA) She experienced a TIA in December 2024 and another episode of blurred vision in February 2025. She manages her condition with baby aspirin and hydration. A brief episode of blurred vision last month was managed by resting. - Continue baby aspirin - Monitor for TIA symptoms - Encourage adequate hydration  Hypertension Blood pressure is 135/72 mmHg. She adheres to her antihypertensive medication and baby aspirin. She is advised to maintain a balanced diet and regular physical activity, and to avoid excessive sweets. - Continue antihypertensive medication - Encourage 30 minutes of exercise most days - Promote a balanced diet with vegetables and protein - Regularly monitor blood pressure  Prediabetes Her A1c was 5.9% in November 2024, indicating prediabetes. She lacks symptoms of diabetes but had a brief episode of blurred vision last month. She plans to increase physical activity and reduce carbohydrate intake. She is considering weight management medications like Wegovy or Ozempic, pending insurance coverage. - Do not repeat A1c today - Encourage carbohydrate monitoring - Promote increased physical activity - Consider follow-up for weight management and potential use of Wegovy or Ozempic  Urinary incontinence She experiences urinary incontinence, particularly after taking torsemide. She manages with pads and timing medication intake. Reducing torsemide dose was discussed but she currently takes it  once daily due to fluid retention. - Continue current management with pads and medication timing - Consider reducing torsemide to 10 mg if urgency worsens  Weight management She is considering [id number] for weight loss and follows a Weight Watchers program. She previously lost 35 pounds on a ketogenic diet but is concerned about sodium content. Insurance coverage for Reginal Lutes is pending. She is encouraged to continue lifestyle modifications before pharmacotherapy. - Check insurance  coverage for Wegovy or Ozempic - Consider virtual follow-up for weight management - Encourage increased physical activity and healthy eating, focusing on vegetables and reducing carbohydrates  Medication management She takes torsemide for fluid retention, causing urinary incontinence. She takes it once daily instead of twice due to urgency. She is consistent with antihypertensive  and aspirin but inconsistent with cholesterol medication post-[place] trip. - Consider reducing torsemide to 10 mg if incontinence worsens - Encourage consistent medication use, including cholesterol medication - Monitor weight and fluid retention - Discuss [employer] insurance coverage and prior authorization - Consider virtual appointment for weight management and potential use of Wegovy or Ozempic         In addition, I have reviewed and discussed with patient certain preventive protocols, quality metrics, and best practice recommendations. A written personalized care plan for preventive services as well as general preventive health recommendations were provided to patient.     Ronnald Ramp, MD   10/06/2023   After Visit Summary: (In Person-Printed) AVS printed and given to the patient

## 2023-10-07 ENCOUNTER — Other Ambulatory Visit: Payer: Self-pay

## 2023-10-07 ENCOUNTER — Encounter: Payer: Self-pay | Admitting: Family Medicine

## 2023-10-07 DIAGNOSIS — E875 Hyperkalemia: Secondary | ICD-10-CM

## 2023-10-07 LAB — LIPID PANEL
Chol/HDL Ratio: 3.4 ratio (ref 0.0–4.4)
Cholesterol, Total: 188 mg/dL (ref 100–199)
HDL: 56 mg/dL (ref >39)
LDL Chol Calc (NIH): 111 mg/dL — ABNORMAL HIGH (ref 0–99)
Triglycerides: 119 mg/dL (ref 0–149)
VLDL Cholesterol Cal: 21 mg/dL (ref 5–40)

## 2023-10-07 LAB — BMP8+EGFR
BUN/Creatinine Ratio: 22 (ref 12–28)
BUN: 15 mg/dL (ref 8–27)
CO2: 21 mmol/L (ref 20–29)
Calcium: 10.2 mg/dL (ref 8.7–10.3)
Chloride: 103 mmol/L (ref 96–106)
Creatinine, Ser: 0.67 mg/dL (ref 0.57–1.00)
Glucose: 96 mg/dL (ref 70–99)
Potassium: 5.3 mmol/L — ABNORMAL HIGH (ref 3.5–5.2)
Sodium: 140 mmol/L (ref 134–144)
eGFR: 96 mL/min/{1.73_m2} (ref >59)

## 2023-10-27 LAB — BASIC METABOLIC PANEL WITH GFR
BUN/Creatinine Ratio: 20 (ref 12–28)
BUN: 15 mg/dL (ref 8–27)
CO2: 22 mmol/L (ref 20–29)
Calcium: 10.3 mg/dL (ref 8.7–10.3)
Chloride: 103 mmol/L (ref 96–106)
Creatinine, Ser: 0.74 mg/dL (ref 0.57–1.00)
Glucose: 92 mg/dL (ref 70–99)
Potassium: 4.8 mmol/L (ref 3.5–5.2)
Sodium: 142 mmol/L (ref 134–144)
eGFR: 89 mL/min/{1.73_m2} (ref >59)

## 2023-10-28 ENCOUNTER — Encounter: Payer: Self-pay | Admitting: Family Medicine

## 2023-11-15 ENCOUNTER — Ambulatory Visit
Admission: RE | Admit: 2023-11-15 | Discharge: 2023-11-15 | Disposition: A | Discharge status: Home / Self Care | Source: Ambulatory Visit | Attending: Family Medicine | Admitting: Family Medicine

## 2023-11-15 DIAGNOSIS — Z13820 Encounter for screening for osteoporosis: Secondary | ICD-10-CM | POA: Insufficient documentation

## 2023-11-15 DIAGNOSIS — Z780 Asymptomatic menopausal state: Secondary | ICD-10-CM | POA: Diagnosis present

## 2023-11-15 DIAGNOSIS — Z1231 Encounter for screening mammogram for malignant neoplasm of breast: Secondary | ICD-10-CM | POA: Diagnosis present

## 2023-11-17 ENCOUNTER — Encounter: Payer: Self-pay | Admitting: Family Medicine

## 2023-11-17 ENCOUNTER — Other Ambulatory Visit: Payer: Self-pay | Admitting: Family Medicine

## 2023-11-17 DIAGNOSIS — R928 Other abnormal and inconclusive findings on diagnostic imaging of breast: Secondary | ICD-10-CM

## 2023-11-22 ENCOUNTER — Other Ambulatory Visit: Payer: Self-pay | Admitting: Family Medicine

## 2023-11-22 NOTE — Telephone Encounter (Signed)
 Copied from CRM [id number]. Topic: Clinical - Medication Refill >> Nov 22, 2023  4:25 PM Luane Rumps D wrote: Most Recent Primary Care Visit:  Provider: Mimi Alt  Department: [facility]  Visit Type: PHYSICAL  Date: 10/06/2023  Medication: rosuvastatin  (CRESTOR ) 20 MG tablet, torsemide  (DEMADEX ) 20 MG tablet  Has the patient contacted their pharmacy? No, pill bottle said 0 refills (Agent: If no, request that the patient contact the pharmacy for the refill. If patient does not wish to contact the pharmacy document the reason why and proceed with request.) (Agent: If yes, when and what did the pharmacy advise?)  Is this the correct pharmacy for this prescription? Yes If no, delete pharmacy and type the correct one.  This is the patient's preferred pharmacy:  [id number] - [place], [place] - [address] AT [facility] OF [address] [place] [address] Phone: [phone] Fax: [phone]   Has the prescription been filled recently? Yes  Is the patient out of the medication? Yes  Has the patient been seen for an appointment in the last year OR does the patient have an upcoming appointment? Yes  Can we respond through MyChart? Yes  Agent: Please be advised that Rx refills may take up to 3 business days. We ask that you follow-up with your pharmacy.

## 2023-11-23 ENCOUNTER — Ambulatory Visit
Admission: RE | Admit: 2023-11-23 | Discharge: 2023-11-23 | Disposition: A | Discharge status: Home / Self Care | Source: Ambulatory Visit | Attending: Family Medicine | Admitting: Family Medicine

## 2023-11-23 DIAGNOSIS — R928 Other abnormal and inconclusive findings on diagnostic imaging of breast: Secondary | ICD-10-CM

## 2023-11-25 MED ORDER — ROSUVASTATIN CALCIUM 20 MG PO TABS: 20.0000 mg | ORAL_TABLET | Freq: Every day | ORAL | 2 refills | Status: DC

## 2023-11-25 MED ORDER — TORSEMIDE 20 MG PO TABS: 20.0000 mg | ORAL_TABLET | Freq: Two times a day (BID) | ORAL | 3 refills | Status: AC

## 2023-11-25 NOTE — Telephone Encounter (Signed)
 Requested medication (s) are due for refill today: yes to both rx  Requested medication (s) are on the active medication list: yes  Last refill:  Rosuvastatin : 07/13/23 # 30 2 RF        Torsemide : 09/08/22 #180 3 RF  Future visit scheduled: yes  Notes to clinic:  prescriber no longer with this practice   Requested Prescriptions  Pending Prescriptions Disp Refills   torsemide  (DEMADEX ) 20 MG tablet 180 tablet 3    Sig: Take 1 tablet (20 mg total) by mouth 2 (two) times daily.     Cardiovascular:  Diuretics - Loop Passed - 11/25/2023 11:54 AM      Passed - K in normal range and within 180 days    Potassium  Date Value Ref Range Status  10/26/2023 4.8 3.5 - 5.2 mmol/L Final  11/03/2012 3.8 3.5 - 5.1 mmol/L Final         Passed - Ca in normal range and within 180 days    Calcium   Date Value Ref Range Status  10/26/2023 10.3 8.7 - 10.3 mg/dL Final   Calcium , Total  Date Value Ref Range Status  11/03/2012 9.5 8.5 - 10.1 mg/dL Final         Passed - Na in normal range and within 180 days    Sodium  Date Value Ref Range Status  10/26/2023 142 134 - 144 mmol/L Final  11/03/2012 138 136 - 145 mmol/L Final         Passed - Cr in normal range and within 180 days    Creatinine  Date Value Ref Range Status  11/03/2012 0.76 0.60 - 1.30 mg/dL Final   Creatinine, Ser  Date Value Ref Range Status  10/26/2023 0.74 0.57 - 1.00 mg/dL Final         Passed - Cl in normal range and within 180 days    Chloride  Date Value Ref Range Status  10/26/2023 103 96 - 106 mmol/L Final  11/03/2012 107 98 - 107 mmol/L Final         Passed - Mg Level in normal range and within 180 days    Magnesium  Date Value Ref Range Status  06/20/2023 2.2 1.6 - 2.3 mg/dL Final         Passed - Last BP in normal range    BP Readings from Last 1 Encounters:  10/06/23 135/72         Passed - Valid encounter within last 6 months    Recent Outpatient Visits           1 month ago Encounter for  [employer] annual wellness exam   [place] [facility] Martin, Raemon, MD       Future Appointments             In 10 months Simmons-Robinson, Makiera, MD [facility]             rosuvastatin  (CRESTOR ) 20 MG tablet 30 tablet 2    Sig: Take 1 tablet (20 mg total) by mouth daily.     Cardiovascular:  Antilipid - Statins 2 Failed - 11/25/2023 11:54 AM      Failed - Lipid Panel in normal range within the last 12 months    Cholesterol, Total  Date Value Ref Range Status  10/06/2023 188 100 - 199 mg/dL Final   LDL Chol Calc (NIH)  Date Value Ref Range Status  10/06/2023 111 (H) 0 - 99 mg/dL Final  LDL Direct  Date Value Ref Range Status  06/20/2023 114 (H) 0 - 99 mg/dL Final   HDL  Date Value Ref Range Status  10/06/2023 56 >39 mg/dL Final   Triglycerides  Date Value Ref Range Status  10/06/2023 119 0 - 149 mg/dL Final         Passed - Cr in normal range and within 360 days    Creatinine  Date Value Ref Range Status  11/03/2012 0.76 0.60 - 1.30 mg/dL Final   Creatinine, Ser  Date Value Ref Range Status  10/26/2023 0.74 0.57 - 1.00 mg/dL Final         Passed - Patient is not pregnant      Passed - Valid encounter within last 12 months    Recent Outpatient Visits           1 month ago Encounter for [employer] annual wellness exam   [place] [facility] Simmons-Robinson, Judyann Number, MD       Future Appointments             In 10 months Simmons-Robinson, Judyann Number, MD [facility]

## 2024-02-28 ENCOUNTER — Other Ambulatory Visit: Payer: Self-pay | Admitting: Family Medicine

## 2024-02-28 NOTE — Telephone Encounter (Unsigned)
 Copied from CRM [id number]. Topic: Clinical - Medication Refill >> Feb 28, 2024  1:36 PM Avram G wrote: Medication: rosuvastatin  (CRESTOR ) 20 MG tablet [[id number]]  Has the patient contacted their pharmacy? No (Agent: If no, request that the patient contact the pharmacy for the refill. If patient does not wish to contact the pharmacy document the reason why and proceed with request.) (Agent: If yes, when and what did the pharmacy advise?)  This is the patient's preferred pharmacy:  [id number] [place], [place] - [address] AT [facility] OF [address] [address] [place] [address] Phone: [phone] Fax: [phone]  Is this the correct pharmacy for this prescription? Yes If no, delete pharmacy and type the correct one.   Has the prescription been filled recently? No  Is the patient out of the medication? Yes  Has the patient been seen for an appointment in the last year OR does the patient have an upcoming appointment? Yes  Can we respond through MyChart? Yes  Agent: Please be advised that Rx refills may take up to 3 business days. We ask that you follow-up with your pharmacy.

## 2024-03-01 MED ORDER — ROSUVASTATIN CALCIUM 20 MG PO TABS: 20.0000 mg | ORAL_TABLET | Freq: Every day | ORAL | 2 refills | Status: AC

## 2024-03-01 NOTE — Telephone Encounter (Signed)
 Requested Prescriptions  Pending Prescriptions Disp Refills   rosuvastatin  (CRESTOR ) 20 MG tablet 90 tablet 2    Sig: Take 1 tablet (20 mg total) by mouth daily.     Cardiovascular:  Antilipid - Statins 2 Failed - 03/01/2024 11:06 AM      Failed - Lipid Panel in normal range within the last 12 months    Cholesterol, Total  Date Value Ref Range Status  10/06/2023 188 100 - 199 mg/dL Final   LDL Chol Calc (NIH)  Date Value Ref Range Status  10/06/2023 111 (H) 0 - 99 mg/dL Final   LDL Direct  Date Value Ref Range Status  06/20/2023 114 (H) 0 - 99 mg/dL Final   HDL  Date Value Ref Range Status  10/06/2023 56 >39 mg/dL Final   Triglycerides  Date Value Ref Range Status  10/06/2023 119 0 - 149 mg/dL Final         Passed - Cr in normal range and within 360 days    Creatinine  Date Value Ref Range Status  11/03/2012 0.76 0.60 - 1.30 mg/dL Final   Creatinine, Ser  Date Value Ref Range Status  10/26/2023 0.74 0.57 - 1.00 mg/dL Final         Passed - Patient is not pregnant      Passed - Valid encounter within last 12 months    Recent Outpatient Visits           4 months ago Encounter for [employer] annual wellness exam   [facility] Simmons-Robinson, Rockie, MD       Future Appointments             In 7 months Simmons-Robinson, Rockie, MD [facility]

## 2024-04-19 ENCOUNTER — Telehealth: Payer: Self-pay | Admitting: Family Medicine

## 2024-04-19 DIAGNOSIS — I10 Essential (primary) hypertension: Secondary | ICD-10-CM

## 2024-04-19 NOTE — Telephone Encounter (Signed)
 [facility] faxed refill request for the following medications:  olmesartan  (BENICAR ) 5 MG tablet    Please advise.

## 2024-04-19 NOTE — Telephone Encounter (Signed)
 Looks lie there is one refill left at the pharmacy.

## 2024-04-20 NOTE — Progress Notes (Addendum)
 Pharmacy Quality Measure Review  This patient is appearing on a report for being at risk of failing the adherence measure for hypertension (ACEi/ARB) medications this calendar year.   Medication: olmesartan   Last fill date: 04/17/24 for 90 day supply  Insurance report was not up to date. No action needed at this time.   Hilda Wexler E. Marsh, PharmD Clinical Pharmacist [facility] [phone]

## 2024-06-01 NOTE — Progress Notes (Signed)
 Pharmacy Quality Measure Review  This patient is appearing on a report for being at risk of failing the adherence measure for cholesterol (statin) medications this calendar year.   Medication: rosuvastatin   Last fill date: 05/28/24 for 90 day supply  Insurance report was not up to date. No action needed at this time.   Laasia Arcos E. Marsh, PharmD, CPP Clinical Pharmacist [facility] [phone]

## 2024-07-27 ENCOUNTER — Other Ambulatory Visit: Payer: Self-pay | Admitting: Family Medicine

## 2024-07-27 DIAGNOSIS — I10 Essential (primary) hypertension: Secondary | ICD-10-CM

## 2024-07-27 NOTE — Telephone Encounter (Signed)
 Copied from CRM #[id number]. Topic: Clinical - Medication Refill >> Jul 27, 2024  8:56 AM Montie POUR wrote: Medication:  olmesartan  (BENICAR ) 5 MG tablet  Has the patient contacted their pharmacy? Yes (Agent: If no, request that the patient contact the pharmacy for the refill. If patient does not wish to contact the pharmacy document the reason why and proceed with request.) (Agent: If yes, when and what did the pharmacy advise?) Pharmacy needs order to refill  This is the patient's preferred pharmacy:  [id number] [place], [place] - [address] AT [facility] [address] [address] [place] [address] Phone: [phone] Fax: [phone]  Is this the correct pharmacy for this prescription? Yes If no, delete pharmacy and type the correct one.   Has the prescription been filled recently? No  Is the patient out of the medication? Yes - She ran out today  Has the patient been seen for an appointment in the last year OR does the patient have an upcoming appointment? Yes  Can we respond through MyChart? No  Agent: Please be advised that Rx refills may take up to 3 business days. We ask that you follow-up with your pharmacy.

## 2024-07-27 NOTE — Telephone Encounter (Signed)
 Requested medications are due for refill today.  yes  Requested medications are on the active medications list.  yes  Last refill. 07/12/2024  Future visit scheduled.   yes  Notes to clinic.  Labs are expired.     Requested Prescriptions  Pending Prescriptions Disp Refills   olmesartan  (BENICAR ) 5 MG tablet 90 tablet 3    Sig: Take 1 tablet (5 mg total) by mouth daily.     Cardiovascular:  Angiotensin Receptor Blockers Failed - 07/27/2024  2:55 PM      Failed - Cr in normal range and within 180 days    Creatinine  Date Value Ref Range Status  11/03/2012 0.76 0.60 - 1.30 mg/dL Final   Creatinine, Ser  Date Value Ref Range Status  10/26/2023 0.74 0.57 - 1.00 mg/dL Final         Failed - K in normal range and within 180 days    Potassium  Date Value Ref Range Status  10/26/2023 4.8 3.5 - 5.2 mmol/L Final  11/03/2012 3.8 3.5 - 5.1 mmol/L Final         Failed - Valid encounter within last 6 months    Recent Outpatient Visits           9 months ago Encounter for [employer] annual wellness exam   [place] [facility] [facility] Sharma Coyer, MD       Future Appointments             In 2 months Simmons-Robinson, Coyer, MD [facility] [place] - Patient is not pregnant      Passed - Last BP in normal range    BP Readings from Last 1 Encounters:  10/06/23 135/72

## 2024-08-01 ENCOUNTER — Telehealth: Payer: Self-pay

## 2024-08-01 DIAGNOSIS — I10 Essential (primary) hypertension: Secondary | ICD-10-CM

## 2024-08-01 NOTE — Telephone Encounter (Signed)
 Copied from CRM [id number]. Topic: Clinical - Medication Question >> Aug 01, 2024  3:55 PM Suzen RAMAN wrote: Reason for CRM: patient requested a refill and was denied by provider b/c patient needed to schedule an appt. Patient currently schedule for 10/08/24 for a physical. Nothing sooner available with PCP. Appt scheduled with available provider for 08/02/24 to address refill concern. Patient would like to know if medication can be refill still

## 2024-08-02 ENCOUNTER — Ambulatory Visit

## 2024-08-02 ENCOUNTER — Other Ambulatory Visit: Payer: Self-pay | Admitting: Family Medicine

## 2024-08-02 DIAGNOSIS — I10 Essential (primary) hypertension: Secondary | ICD-10-CM

## 2024-08-02 MED ORDER — OLMESARTAN MEDOXOMIL 5 MG PO TABS: 5.0000 mg | ORAL_TABLET | Freq: Every day | ORAL | 1 refills | Status: AC

## 2024-08-02 NOTE — Telephone Encounter (Signed)
6 month refill sent to pharmacy

## 2024-10-08 ENCOUNTER — Encounter: Admitting: Family Medicine
